# Patient Record
Sex: Female | Born: 1955 | Race: White | Hispanic: No | Smoking: Never smoker
Health system: Southern US, Community
[De-identification: ages and names within clinical notes are randomized; demographics above are authoritative.]

## PROBLEM LIST (undated history)

## (undated) DIAGNOSIS — G4733 Obstructive sleep apnea (adult) (pediatric): Secondary | ICD-10-CM

## (undated) DIAGNOSIS — R739 Hyperglycemia, unspecified: Secondary | ICD-10-CM

## (undated) DIAGNOSIS — M1990 Unspecified osteoarthritis, unspecified site: Secondary | ICD-10-CM

## (undated) DIAGNOSIS — K8020 Calculus of gallbladder without cholecystitis without obstruction: Secondary | ICD-10-CM

## (undated) DIAGNOSIS — E785 Hyperlipidemia, unspecified: Secondary | ICD-10-CM

## (undated) DIAGNOSIS — K760 Fatty (change of) liver, not elsewhere classified: Secondary | ICD-10-CM

## (undated) DIAGNOSIS — F32A Depression, unspecified: Secondary | ICD-10-CM

## (undated) DIAGNOSIS — F329 Major depressive disorder, single episode, unspecified: Secondary | ICD-10-CM

## (undated) DIAGNOSIS — Z79890 Hormone replacement therapy (postmenopausal): Secondary | ICD-10-CM

## (undated) DIAGNOSIS — K7689 Other specified diseases of liver: Secondary | ICD-10-CM

## (undated) DIAGNOSIS — K219 Gastro-esophageal reflux disease without esophagitis: Secondary | ICD-10-CM

## (undated) DIAGNOSIS — K750 Abscess of liver: Secondary | ICD-10-CM

## (undated) HISTORY — DX: Hormone replacement therapy: Z79.890

## (undated) HISTORY — DX: Gastro-esophageal reflux disease without esophagitis: K21.9

## (undated) HISTORY — DX: Obstructive sleep apnea (adult) (pediatric): G47.33

## (undated) HISTORY — PX: VESICOVAGINAL FISTULA CLOSURE W/ TAH: SUR271

## (undated) HISTORY — DX: Abscess of liver: K75.0

## (undated) HISTORY — PX: INGUINAL HERNIA REPAIR: SHX194

## (undated) HISTORY — DX: Unspecified osteoarthritis, unspecified site: M19.90

## (undated) HISTORY — DX: Hyperglycemia, unspecified: R73.9

## (undated) HISTORY — DX: Major depressive disorder, single episode, unspecified: F32.9

## (undated) HISTORY — DX: Hyperlipidemia, unspecified: E78.5

## (undated) HISTORY — DX: Fatty (change of) liver, not elsewhere classified: K76.0

## (undated) HISTORY — PX: APPENDECTOMY: SHX54

## (undated) HISTORY — DX: Other specified diseases of liver: K76.89

## (undated) HISTORY — PX: EAR CYST EXCISION: SHX22

## (undated) HISTORY — DX: Depression, unspecified: F32.A

## (undated) HISTORY — DX: Calculus of gallbladder without cholecystitis without obstruction: K80.20

---

## 1997-11-11 ENCOUNTER — Ambulatory Visit (HOSPITAL_COMMUNITY): Admission: RE | Admit: 1997-11-11 | Discharge: 1997-11-11 | Payer: Self-pay | Admitting: Obstetrics and Gynecology

## 1998-06-30 ENCOUNTER — Inpatient Hospital Stay (HOSPITAL_COMMUNITY): Admission: RE | Admit: 1998-06-30 | Discharge: 1998-07-02 | Payer: Self-pay | Admitting: Obstetrics and Gynecology

## 1999-07-28 ENCOUNTER — Other Ambulatory Visit: Admission: RE | Admit: 1999-07-28 | Discharge: 1999-07-28 | Payer: Self-pay | Admitting: Obstetrics and Gynecology

## 1999-11-26 ENCOUNTER — Ambulatory Visit (HOSPITAL_COMMUNITY): Admission: RE | Admit: 1999-11-26 | Discharge: 1999-11-26 | Payer: Self-pay | Admitting: Internal Medicine

## 1999-12-03 ENCOUNTER — Ambulatory Visit (HOSPITAL_COMMUNITY): Admission: RE | Admit: 1999-12-03 | Discharge: 1999-12-03 | Payer: Self-pay | Admitting: Internal Medicine

## 1999-12-03 ENCOUNTER — Encounter: Payer: Self-pay | Admitting: Internal Medicine

## 2000-08-31 ENCOUNTER — Other Ambulatory Visit: Admission: RE | Admit: 2000-08-31 | Discharge: 2000-08-31 | Payer: Self-pay | Admitting: Obstetrics and Gynecology

## 2001-09-20 ENCOUNTER — Other Ambulatory Visit: Admission: RE | Admit: 2001-09-20 | Discharge: 2001-09-20 | Payer: Self-pay | Admitting: Obstetrics and Gynecology

## 2002-07-23 ENCOUNTER — Encounter: Payer: Self-pay | Admitting: Internal Medicine

## 2002-07-23 ENCOUNTER — Ambulatory Visit (HOSPITAL_BASED_OUTPATIENT_CLINIC_OR_DEPARTMENT_OTHER): Admission: RE | Admit: 2002-07-23 | Discharge: 2002-07-23 | Payer: Self-pay | Admitting: Internal Medicine

## 2002-10-14 ENCOUNTER — Emergency Department (HOSPITAL_COMMUNITY): Admission: EM | Admit: 2002-10-14 | Discharge: 2002-10-14 | Payer: Self-pay

## 2002-11-06 ENCOUNTER — Other Ambulatory Visit: Admission: RE | Admit: 2002-11-06 | Discharge: 2002-11-06 | Payer: Self-pay | Admitting: Obstetrics and Gynecology

## 2003-05-29 ENCOUNTER — Ambulatory Visit (HOSPITAL_COMMUNITY): Admission: RE | Admit: 2003-05-29 | Discharge: 2003-05-29 | Payer: Self-pay | Admitting: General Surgery

## 2003-05-29 ENCOUNTER — Encounter: Payer: Self-pay | Admitting: General Surgery

## 2003-11-14 ENCOUNTER — Other Ambulatory Visit: Admission: RE | Admit: 2003-11-14 | Discharge: 2003-11-14 | Payer: Self-pay | Admitting: Obstetrics and Gynecology

## 2003-12-05 ENCOUNTER — Ambulatory Visit (HOSPITAL_COMMUNITY): Admission: RE | Admit: 2003-12-05 | Discharge: 2003-12-05 | Payer: Self-pay | Admitting: Internal Medicine

## 2004-03-04 ENCOUNTER — Ambulatory Visit (HOSPITAL_COMMUNITY): Admission: RE | Admit: 2004-03-04 | Discharge: 2004-03-04 | Payer: Self-pay | Admitting: Orthopedic Surgery

## 2004-03-11 ENCOUNTER — Encounter: Admission: RE | Admit: 2004-03-11 | Discharge: 2004-06-09 | Payer: Self-pay | Admitting: Orthopedic Surgery

## 2004-04-24 ENCOUNTER — Ambulatory Visit (HOSPITAL_COMMUNITY): Admission: RE | Admit: 2004-04-24 | Discharge: 2004-04-24 | Payer: Self-pay | Admitting: Obstetrics and Gynecology

## 2004-05-11 ENCOUNTER — Encounter: Admission: RE | Admit: 2004-05-11 | Discharge: 2004-08-09 | Payer: Self-pay | Admitting: Endocrinology

## 2004-07-27 ENCOUNTER — Ambulatory Visit (HOSPITAL_COMMUNITY): Admission: RE | Admit: 2004-07-27 | Discharge: 2004-07-27 | Payer: Self-pay | Admitting: General Surgery

## 2004-08-06 ENCOUNTER — Ambulatory Visit: Payer: Self-pay | Admitting: Internal Medicine

## 2004-09-28 ENCOUNTER — Ambulatory Visit: Payer: Self-pay | Admitting: Internal Medicine

## 2004-10-12 ENCOUNTER — Ambulatory Visit: Payer: Self-pay | Admitting: Internal Medicine

## 2004-11-19 ENCOUNTER — Other Ambulatory Visit: Admission: RE | Admit: 2004-11-19 | Discharge: 2004-11-19 | Payer: Self-pay | Admitting: Obstetrics and Gynecology

## 2005-02-05 ENCOUNTER — Ambulatory Visit (HOSPITAL_COMMUNITY): Admission: RE | Admit: 2005-02-05 | Discharge: 2005-02-05 | Payer: Self-pay | Admitting: Obstetrics and Gynecology

## 2005-02-24 ENCOUNTER — Ambulatory Visit (HOSPITAL_COMMUNITY): Admission: RE | Admit: 2005-02-24 | Discharge: 2005-02-24 | Payer: Self-pay | Admitting: Surgery

## 2005-02-25 ENCOUNTER — Encounter: Admission: RE | Admit: 2005-02-25 | Discharge: 2005-05-26 | Payer: Self-pay | Admitting: Surgery

## 2005-03-17 ENCOUNTER — Ambulatory Visit (HOSPITAL_COMMUNITY): Admission: RE | Admit: 2005-03-17 | Discharge: 2005-03-17 | Payer: Self-pay | Admitting: Surgery

## 2005-04-20 ENCOUNTER — Encounter: Payer: Self-pay | Admitting: Internal Medicine

## 2005-04-26 ENCOUNTER — Ambulatory Visit (HOSPITAL_COMMUNITY): Admission: RE | Admit: 2005-04-26 | Discharge: 2005-04-27 | Payer: Self-pay | Admitting: Otolaryngology

## 2005-05-04 ENCOUNTER — Ambulatory Visit (HOSPITAL_COMMUNITY): Admission: RE | Admit: 2005-05-04 | Discharge: 2005-05-04 | Payer: Self-pay | Admitting: Obstetrics and Gynecology

## 2005-07-16 ENCOUNTER — Encounter: Admission: RE | Admit: 2005-07-16 | Discharge: 2005-10-14 | Payer: Self-pay | Admitting: Surgery

## 2005-08-16 HISTORY — PX: LAPAROSCOPIC GASTRIC BANDING: SHX1100

## 2005-11-22 ENCOUNTER — Other Ambulatory Visit: Admission: RE | Admit: 2005-11-22 | Discharge: 2005-11-22 | Payer: Self-pay | Admitting: Obstetrics and Gynecology

## 2005-11-24 ENCOUNTER — Ambulatory Visit (HOSPITAL_COMMUNITY): Admission: RE | Admit: 2005-11-24 | Discharge: 2005-11-24 | Payer: Self-pay | Admitting: Gastroenterology

## 2005-11-24 ENCOUNTER — Encounter (INDEPENDENT_AMBULATORY_CARE_PROVIDER_SITE_OTHER): Payer: Self-pay | Admitting: *Deleted

## 2005-12-13 ENCOUNTER — Ambulatory Visit: Payer: Self-pay | Admitting: Internal Medicine

## 2006-01-03 ENCOUNTER — Ambulatory Visit (HOSPITAL_COMMUNITY): Admission: RE | Admit: 2006-01-03 | Discharge: 2006-01-03 | Payer: Self-pay | Admitting: Physician Assistant

## 2006-03-03 ENCOUNTER — Encounter: Admission: RE | Admit: 2006-03-03 | Discharge: 2006-03-03 | Payer: Self-pay | Admitting: Surgery

## 2006-05-11 ENCOUNTER — Ambulatory Visit (HOSPITAL_COMMUNITY): Admission: RE | Admit: 2006-05-11 | Discharge: 2006-05-11 | Payer: Self-pay | Admitting: Internal Medicine

## 2006-05-20 ENCOUNTER — Encounter: Admission: RE | Admit: 2006-05-20 | Discharge: 2006-05-20 | Payer: Self-pay | Admitting: Internal Medicine

## 2006-07-10 IMAGING — CR DG FOOT COMPLETE 3+V*L*
3 series · 3 of 3 positions shown · non-contrast
Comparison: none

CLINICAL DATA: Pain and bruising after dropping mirror on toes.
LEFT FOOT COMPLETE:
Three views of the left foot reveals no evidence of fracture, dislocation or other acute bony abnormality.    inferior calcaneal spur is noted.

[view not recorded (1 of 3)]
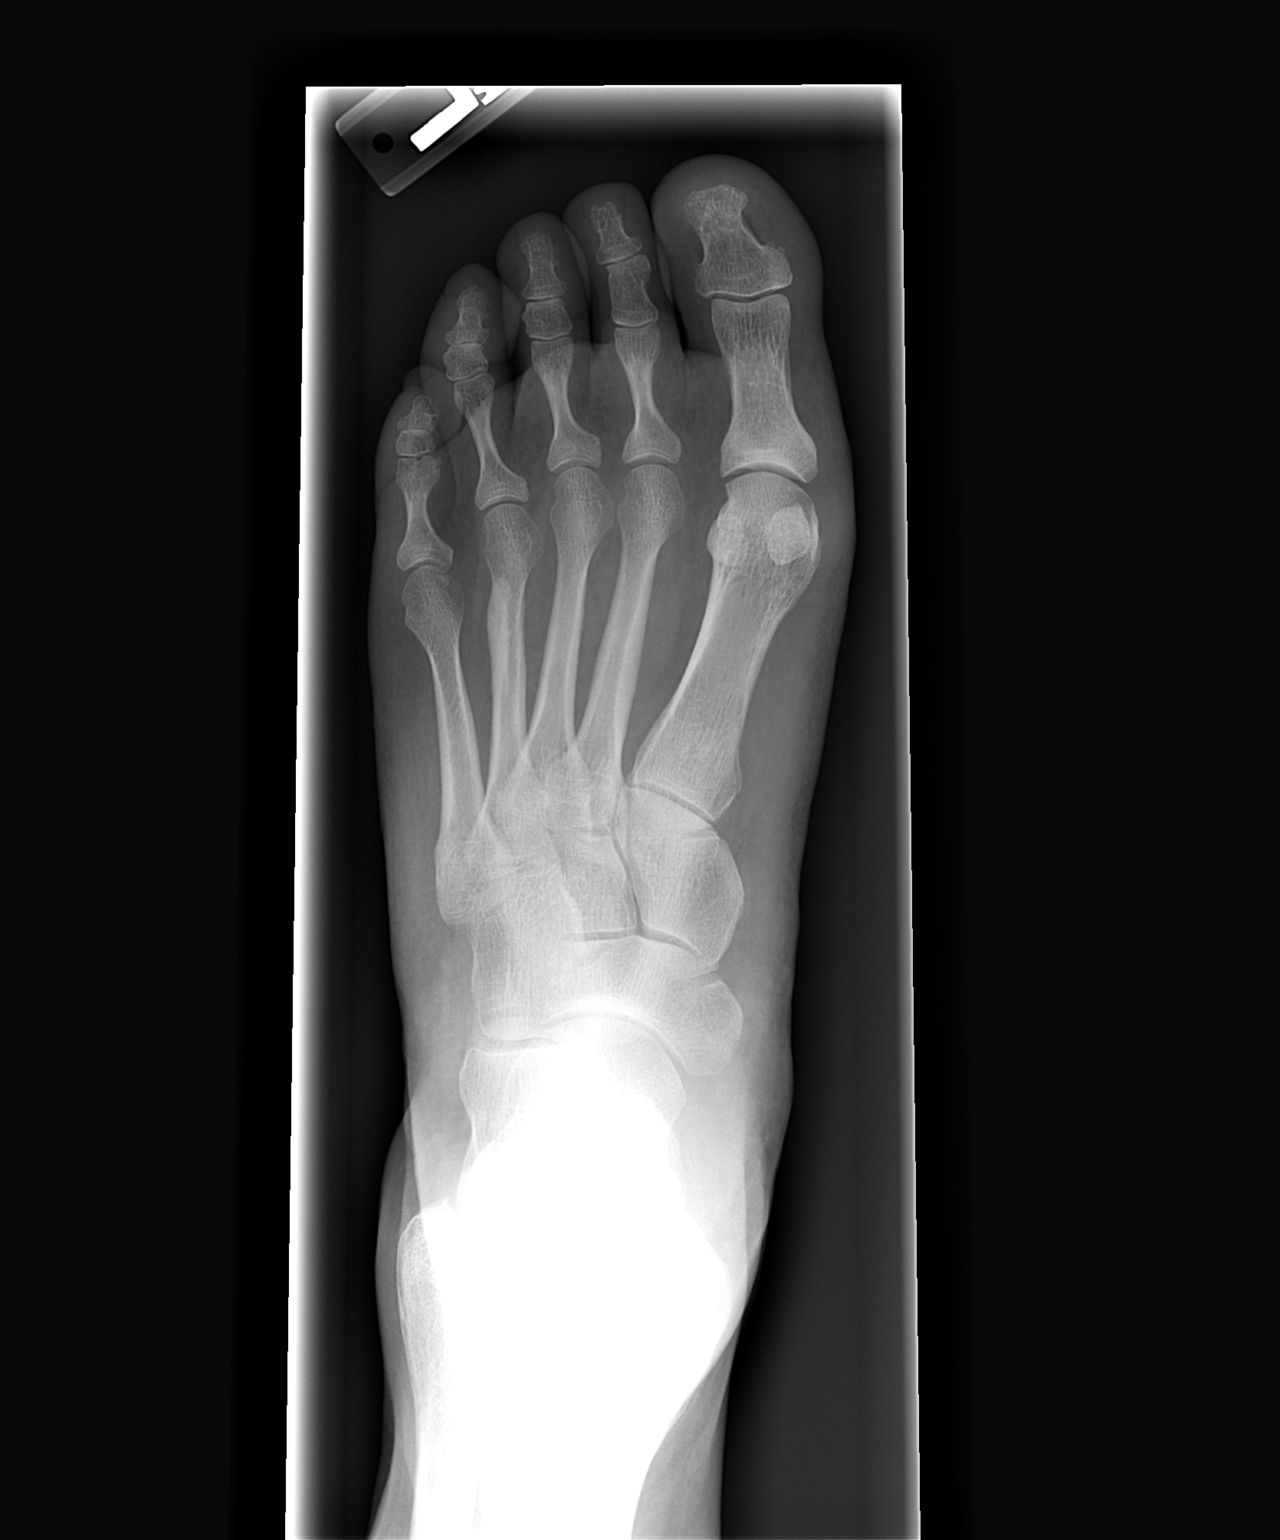

[view not recorded (2 of 3)]
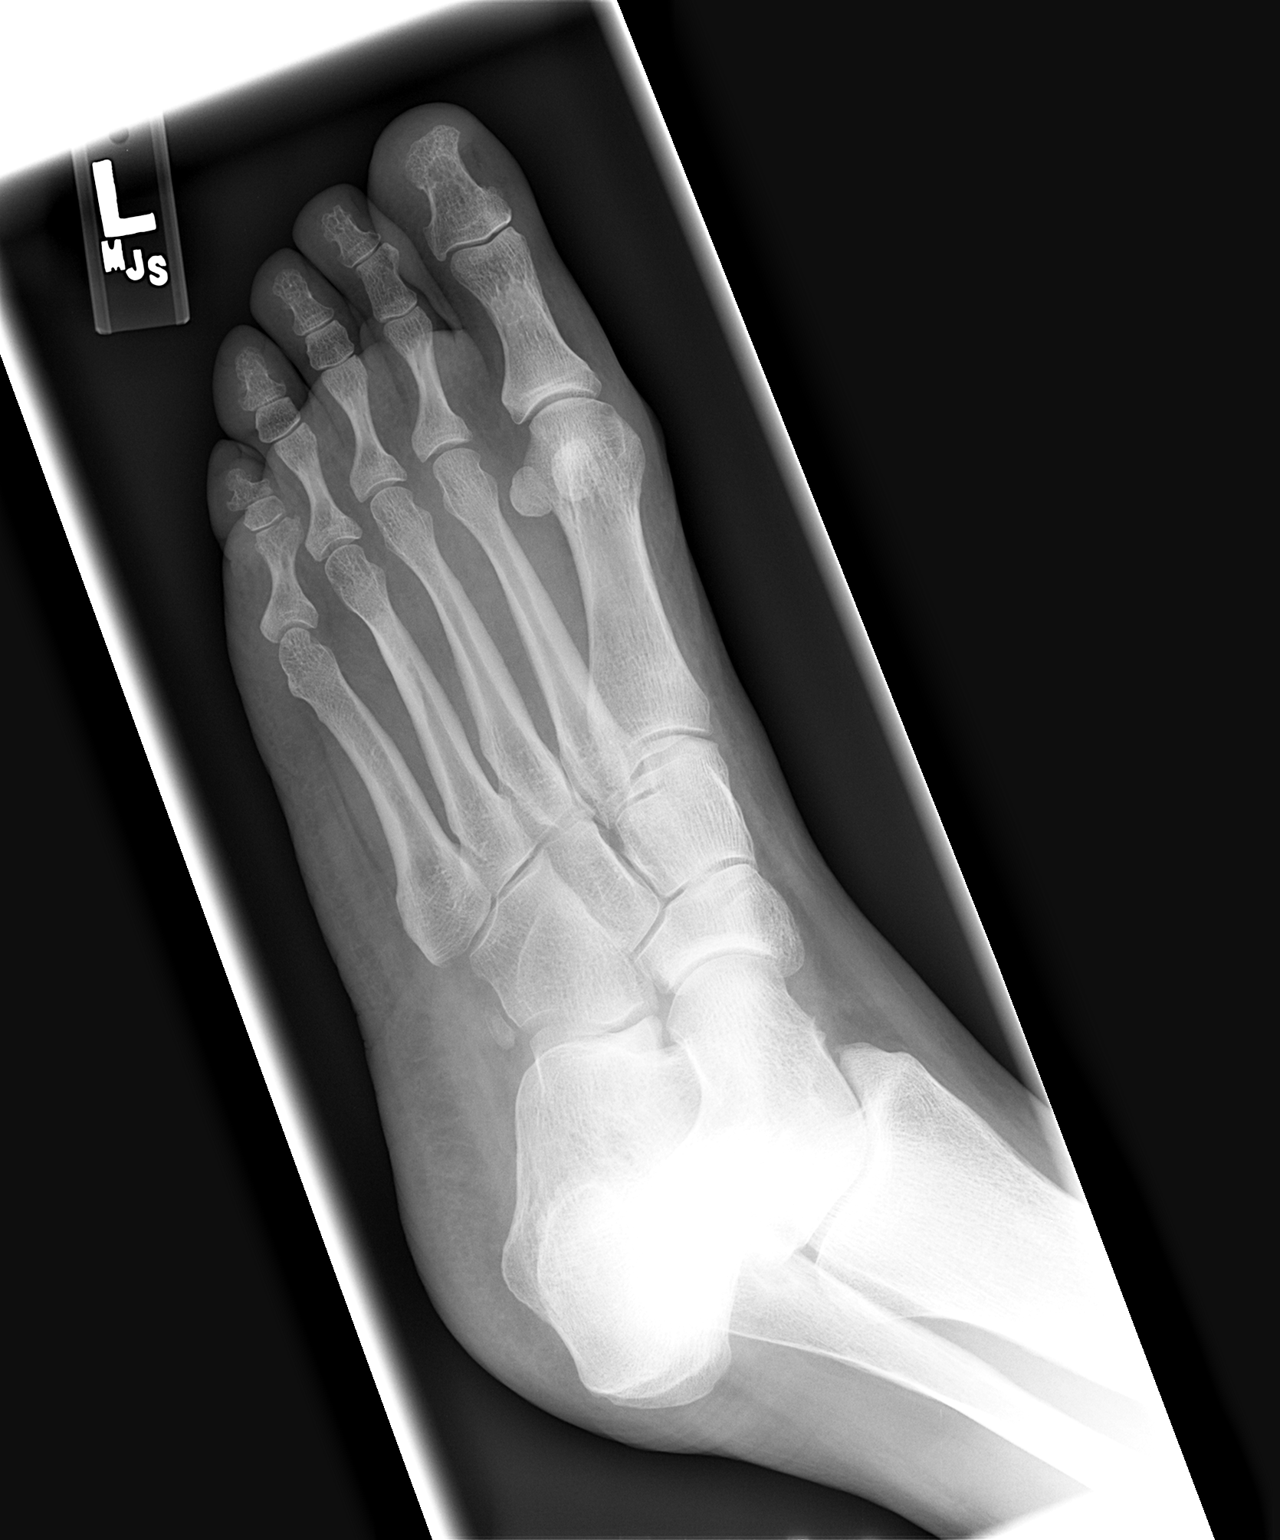

[view not recorded (3 of 3)]
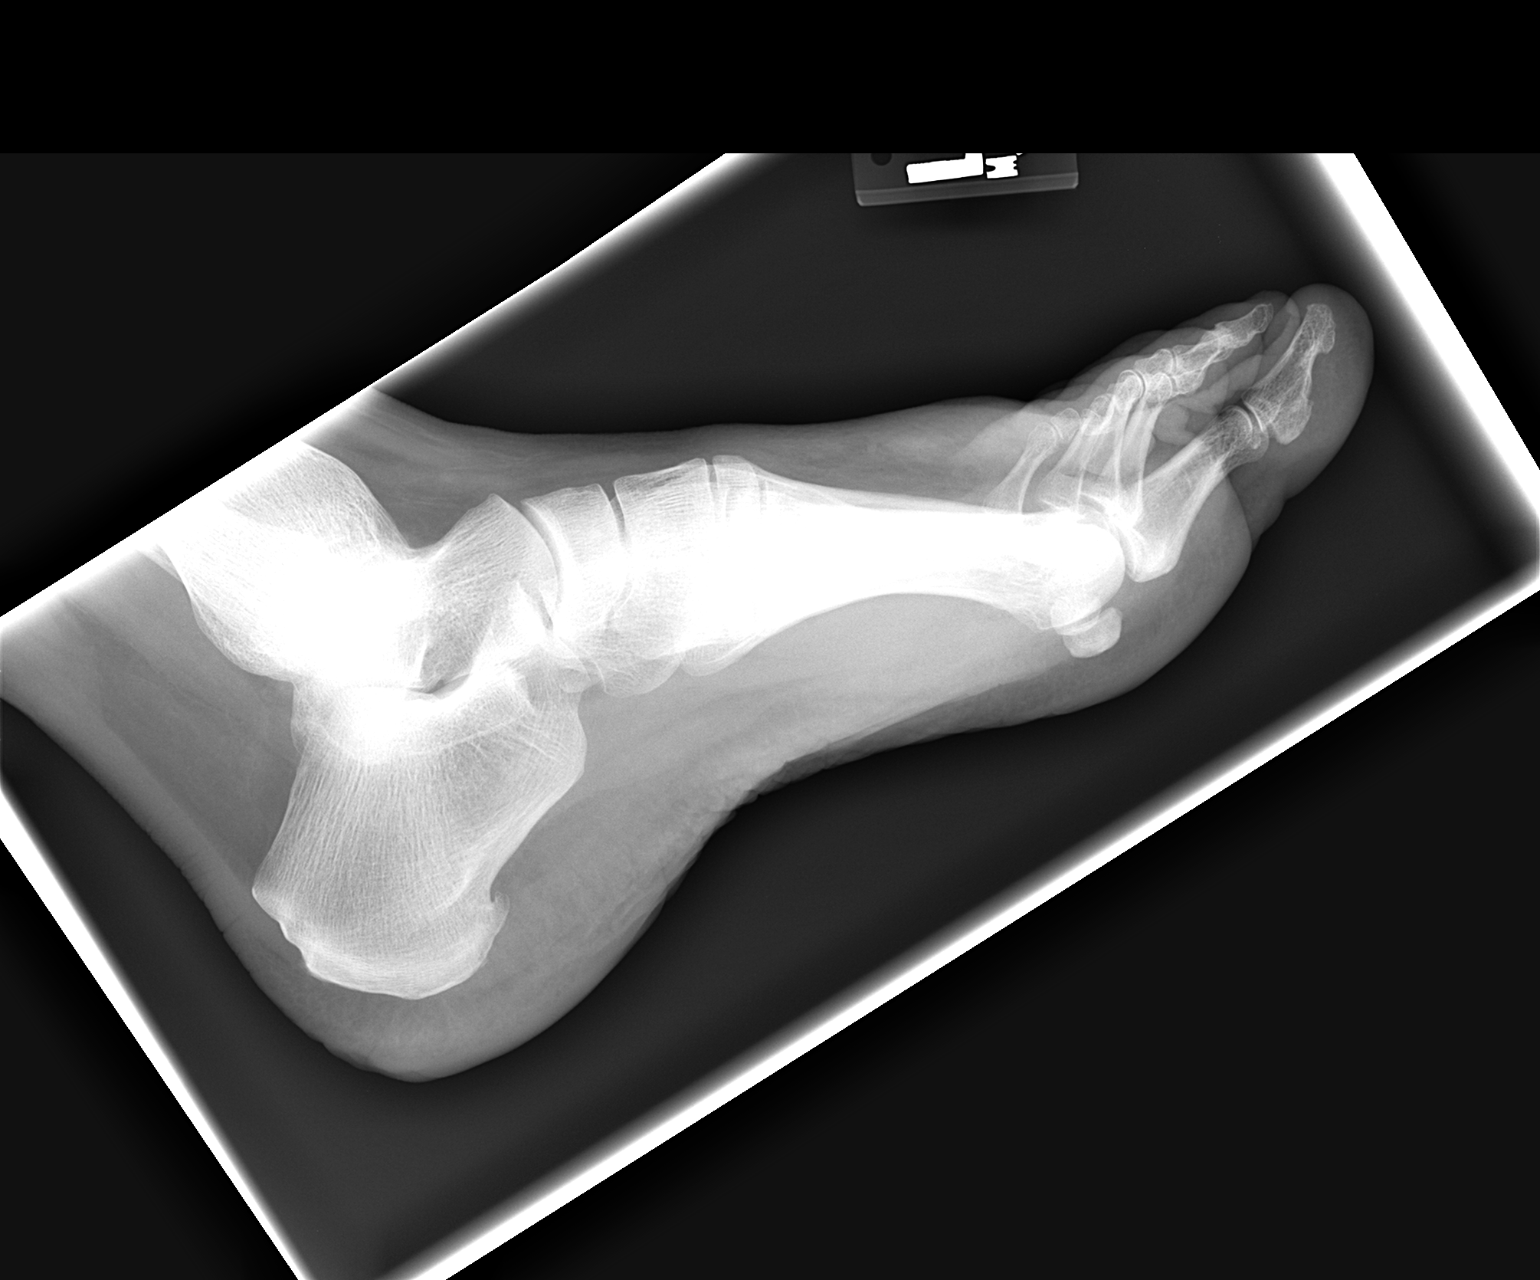

[3 of 3 positions shown; findings below may reference images not displayed]

IMPRESSION: No acute bony abnormalities noted of the left foot.

## 2006-09-08 ENCOUNTER — Ambulatory Visit (HOSPITAL_COMMUNITY): Admission: RE | Admit: 2006-09-08 | Discharge: 2006-09-08 | Payer: Self-pay | Admitting: Orthopedic Surgery

## 2006-09-16 ENCOUNTER — Ambulatory Visit (HOSPITAL_COMMUNITY): Admission: RE | Admit: 2006-09-16 | Discharge: 2006-09-16 | Payer: Self-pay | Admitting: Orthopedic Surgery

## 2006-12-19 ENCOUNTER — Ambulatory Visit: Payer: Self-pay | Admitting: Internal Medicine

## 2006-12-19 LAB — CONVERTED CEMR LAB
ALT: 23 units/L (ref 0–40)
AST: 29 units/L (ref 0–37)
Albumin: 4.2 g/dL (ref 3.5–5.2)
Basophils Absolute: 0 10*3/uL (ref 0.0–0.1)
Basophils Relative: 0.4 % (ref 0.0–1.0)
Bilirubin, Direct: 0.1 mg/dL (ref 0.0–0.3)
Cholesterol: 174 mg/dL (ref 0–200)
Creatinine, Ser: 0.8 mg/dL (ref 0.4–1.2)
Eosinophils Absolute: 0.1 10*3/uL (ref 0.0–0.6)
Eosinophils Relative: 2.5 % (ref 0.0–5.0)
GFR calc Af Amer: 97 mL/min
GFR calc non Af Amer: 80 mL/min
Glucose, Bld: 89 mg/dL (ref 70–99)
HDL: 59 mg/dL (ref >39.0)
LDL Cholesterol: 86 mg/dL (ref 0–99)
MCHC: 35.3 g/dL (ref 30.0–36.0)
Neutrophils Relative %: 44.4 % (ref 43.0–77.0)
Platelets: 261 10*3/uL (ref 150–400)
RBC: 4.23 M/uL (ref 3.87–5.11)
RDW: 12.3 % (ref 11.5–14.6)
Total Bilirubin: 0.6 mg/dL (ref 0.3–1.2)
Triglycerides: 143 mg/dL (ref 0–149)

## 2006-12-26 ENCOUNTER — Ambulatory Visit: Payer: Self-pay | Admitting: Internal Medicine

## 2007-03-23 ENCOUNTER — Ambulatory Visit (HOSPITAL_COMMUNITY): Admission: RE | Admit: 2007-03-23 | Discharge: 2007-03-23 | Payer: Self-pay | Admitting: General Surgery

## 2007-03-23 ENCOUNTER — Telehealth: Payer: Self-pay | Admitting: Internal Medicine

## 2007-04-12 ENCOUNTER — Ambulatory Visit: Payer: Self-pay | Admitting: Internal Medicine

## 2007-04-12 DIAGNOSIS — K219 Gastro-esophageal reflux disease without esophagitis: Secondary | ICD-10-CM | POA: Insufficient documentation

## 2007-04-12 DIAGNOSIS — E785 Hyperlipidemia, unspecified: Secondary | ICD-10-CM | POA: Insufficient documentation

## 2007-04-12 DIAGNOSIS — M1990 Unspecified osteoarthritis, unspecified site: Secondary | ICD-10-CM | POA: Insufficient documentation

## 2007-04-12 DIAGNOSIS — F329 Major depressive disorder, single episode, unspecified: Secondary | ICD-10-CM

## 2007-04-12 DIAGNOSIS — F3289 Other specified depressive episodes: Secondary | ICD-10-CM | POA: Insufficient documentation

## 2007-04-12 DIAGNOSIS — R5381 Other malaise: Secondary | ICD-10-CM | POA: Insufficient documentation

## 2007-04-12 DIAGNOSIS — R5383 Other fatigue: Secondary | ICD-10-CM

## 2007-04-14 ENCOUNTER — Telehealth: Payer: Self-pay | Admitting: Internal Medicine

## 2007-04-25 ENCOUNTER — Encounter: Payer: Self-pay | Admitting: Internal Medicine

## 2007-05-25 ENCOUNTER — Ambulatory Visit (HOSPITAL_COMMUNITY): Admission: RE | Admit: 2007-05-25 | Discharge: 2007-05-25 | Payer: Self-pay | Admitting: Obstetrics and Gynecology

## 2007-10-24 ENCOUNTER — Telehealth: Payer: Self-pay | Admitting: Internal Medicine

## 2008-01-23 ENCOUNTER — Emergency Department (HOSPITAL_COMMUNITY): Admission: EM | Admit: 2008-01-23 | Discharge: 2008-01-23 | Payer: Self-pay | Admitting: Emergency Medicine

## 2008-01-23 ENCOUNTER — Ambulatory Visit: Payer: Self-pay | Admitting: Infectious Diseases

## 2008-01-23 DIAGNOSIS — R509 Fever, unspecified: Secondary | ICD-10-CM | POA: Insufficient documentation

## 2008-01-23 LAB — CONVERTED CEMR LAB
ALT: 20 units/L (ref 0–35)
Albumin: 4.6 g/dL (ref 3.5–5.2)
Bilirubin, Direct: 0.1 mg/dL (ref 0.0–0.3)
Total Bilirubin: 0.4 mg/dL (ref 0.3–1.2)

## 2008-06-20 ENCOUNTER — Ambulatory Visit (HOSPITAL_COMMUNITY): Admission: RE | Admit: 2008-06-20 | Discharge: 2008-06-20 | Payer: Self-pay | Admitting: Obstetrics and Gynecology

## 2008-10-14 LAB — CONVERTED CEMR LAB: Pap Smear: NORMAL

## 2008-10-16 ENCOUNTER — Encounter: Payer: Self-pay | Admitting: Internal Medicine

## 2008-11-04 ENCOUNTER — Encounter: Payer: Self-pay | Admitting: Internal Medicine

## 2008-12-09 ENCOUNTER — Telehealth: Payer: Self-pay | Admitting: *Deleted

## 2009-02-21 ENCOUNTER — Telehealth: Payer: Self-pay | Admitting: *Deleted

## 2009-05-03 ENCOUNTER — Ambulatory Visit (HOSPITAL_COMMUNITY): Admission: RE | Admit: 2009-05-03 | Discharge: 2009-05-03 | Payer: Self-pay | Admitting: Orthopedic Surgery

## 2009-06-10 ENCOUNTER — Ambulatory Visit: Payer: Self-pay | Admitting: Internal Medicine

## 2009-06-10 LAB — CONVERTED CEMR LAB
AST: 31 units/L (ref 0–37)
Alkaline Phosphatase: 40 units/L (ref 39–117)
BUN: 10 mg/dL (ref 6–23)
Basophils Absolute: 0.1 10*3/uL (ref 0.0–0.1)
Bilirubin Urine: NEGATIVE
Bilirubin, Direct: 0 mg/dL (ref 0.0–0.3)
CO2: 28 meq/L (ref 19–32)
Chloride: 107 meq/L (ref 96–112)
Eosinophils Absolute: 0.2 10*3/uL (ref 0.0–0.7)
Eosinophils Relative: 4.3 % (ref 0.0–5.0)
Glucose, Bld: 95 mg/dL (ref 70–99)
Glucose, Urine, Semiquant: NEGATIVE
Hemoglobin: 12.5 g/dL (ref 12.0–15.0)
Ketones, urine, test strip: NEGATIVE
Lymphocytes Relative: 37.2 % (ref 12.0–46.0)
Monocytes Absolute: 0.4 10*3/uL (ref 0.1–1.0)
Monocytes Relative: 6.7 % (ref 3.0–12.0)
Potassium: 4.2 meq/L (ref 3.5–5.1)
RDW: 13.4 % (ref 11.5–14.6)
Sodium: 144 meq/L (ref 135–145)
Total Protein: 6.6 g/dL (ref 6.0–8.3)
Triglycerides: 91 mg/dL (ref 0.0–149.0)
Urobilinogen, UA: 1
VLDL: 18.2 mg/dL (ref 0.0–40.0)
WBC Urine, dipstick: NEGATIVE
pH: 8.5

## 2009-06-17 ENCOUNTER — Ambulatory Visit: Payer: Self-pay | Admitting: Internal Medicine

## 2009-06-17 DIAGNOSIS — IMO0002 Reserved for concepts with insufficient information to code with codable children: Secondary | ICD-10-CM | POA: Insufficient documentation

## 2009-06-17 DIAGNOSIS — E669 Obesity, unspecified: Secondary | ICD-10-CM | POA: Insufficient documentation

## 2009-06-17 DIAGNOSIS — R0989 Other specified symptoms and signs involving the circulatory and respiratory systems: Secondary | ICD-10-CM | POA: Insufficient documentation

## 2009-06-17 DIAGNOSIS — R0609 Other forms of dyspnea: Secondary | ICD-10-CM | POA: Insufficient documentation

## 2009-06-17 DIAGNOSIS — M1710 Unilateral primary osteoarthritis, unspecified knee: Secondary | ICD-10-CM

## 2009-06-19 DIAGNOSIS — R32 Unspecified urinary incontinence: Secondary | ICD-10-CM | POA: Insufficient documentation

## 2009-07-17 ENCOUNTER — Ambulatory Visit: Payer: Self-pay | Admitting: Internal Medicine

## 2009-07-21 ENCOUNTER — Encounter (INDEPENDENT_AMBULATORY_CARE_PROVIDER_SITE_OTHER): Payer: Self-pay | Admitting: *Deleted

## 2009-07-30 ENCOUNTER — Ambulatory Visit (HOSPITAL_BASED_OUTPATIENT_CLINIC_OR_DEPARTMENT_OTHER): Admission: RE | Admit: 2009-07-30 | Discharge: 2009-07-30 | Payer: Self-pay | Admitting: Internal Medicine

## 2009-07-30 ENCOUNTER — Encounter: Payer: Self-pay | Admitting: Internal Medicine

## 2009-08-02 ENCOUNTER — Ambulatory Visit: Payer: Self-pay | Admitting: Internal Medicine

## 2009-08-18 ENCOUNTER — Ambulatory Visit: Payer: Self-pay | Admitting: Internal Medicine

## 2009-09-08 ENCOUNTER — Telehealth: Payer: Self-pay | Admitting: Internal Medicine

## 2009-09-18 ENCOUNTER — Ambulatory Visit: Payer: Self-pay | Admitting: Internal Medicine

## 2009-09-23 ENCOUNTER — Ambulatory Visit: Payer: Self-pay | Admitting: Internal Medicine

## 2009-10-14 LAB — CONVERTED CEMR LAB: Pap Smear: NORMAL

## 2009-11-15 ENCOUNTER — Encounter: Payer: Self-pay | Admitting: Internal Medicine

## 2009-11-17 ENCOUNTER — Emergency Department (HOSPITAL_COMMUNITY): Admission: EM | Admit: 2009-11-17 | Discharge: 2009-11-17 | Payer: Self-pay | Admitting: Emergency Medicine

## 2009-11-24 ENCOUNTER — Encounter: Payer: Self-pay | Admitting: Internal Medicine

## 2009-11-28 ENCOUNTER — Ambulatory Visit (HOSPITAL_COMMUNITY): Admission: RE | Admit: 2009-11-28 | Discharge: 2009-11-28 | Payer: Self-pay | Admitting: Obstetrics and Gynecology

## 2009-12-30 ENCOUNTER — Encounter: Payer: Self-pay | Admitting: Internal Medicine

## 2009-12-30 ENCOUNTER — Ambulatory Visit (HOSPITAL_COMMUNITY): Admission: RE | Admit: 2009-12-30 | Discharge: 2009-12-30 | Payer: Self-pay | Admitting: Surgery

## 2009-12-31 ENCOUNTER — Encounter: Payer: Self-pay | Admitting: Internal Medicine

## 2010-01-06 ENCOUNTER — Encounter: Payer: Self-pay | Admitting: Internal Medicine

## 2010-01-06 ENCOUNTER — Ambulatory Visit (HOSPITAL_COMMUNITY): Admission: RE | Admit: 2010-01-06 | Discharge: 2010-01-06 | Payer: Self-pay | Admitting: Surgery

## 2010-01-14 ENCOUNTER — Encounter: Payer: Self-pay | Admitting: Internal Medicine

## 2010-01-14 HISTORY — PX: HIATAL HERNIA REPAIR: SHX195

## 2010-01-19 ENCOUNTER — Observation Stay (HOSPITAL_COMMUNITY): Admission: RE | Admit: 2010-01-19 | Discharge: 2010-01-20 | Payer: Self-pay | Admitting: Surgery

## 2010-01-20 ENCOUNTER — Encounter: Payer: Self-pay | Admitting: Internal Medicine

## 2010-02-04 ENCOUNTER — Encounter: Admission: RE | Admit: 2010-02-04 | Discharge: 2010-05-05 | Payer: Self-pay | Admitting: Surgery

## 2010-03-18 ENCOUNTER — Telehealth: Payer: Self-pay | Admitting: Internal Medicine

## 2010-04-08 ENCOUNTER — Encounter: Payer: Self-pay | Admitting: Internal Medicine

## 2010-04-14 ENCOUNTER — Ambulatory Visit (HOSPITAL_COMMUNITY): Admission: RE | Admit: 2010-04-14 | Discharge: 2010-04-14 | Payer: Self-pay | Admitting: Surgery

## 2010-04-14 ENCOUNTER — Encounter: Payer: Self-pay | Admitting: Internal Medicine

## 2010-04-17 ENCOUNTER — Ambulatory Visit: Payer: Self-pay | Admitting: Internal Medicine

## 2010-04-17 DIAGNOSIS — A064 Amebic liver abscess: Secondary | ICD-10-CM | POA: Insufficient documentation

## 2010-04-17 DIAGNOSIS — Z8613 Personal history of malaria: Secondary | ICD-10-CM | POA: Insufficient documentation

## 2010-05-21 ENCOUNTER — Encounter: Payer: Self-pay | Admitting: Internal Medicine

## 2010-06-11 ENCOUNTER — Encounter: Payer: Self-pay | Admitting: Internal Medicine

## 2010-06-15 ENCOUNTER — Encounter: Payer: Self-pay | Admitting: Internal Medicine

## 2010-07-03 ENCOUNTER — Telehealth: Payer: Self-pay | Admitting: *Deleted

## 2010-07-08 ENCOUNTER — Telehealth: Payer: Self-pay | Admitting: Internal Medicine

## 2010-07-28 ENCOUNTER — Ambulatory Visit: Payer: Self-pay | Admitting: Internal Medicine

## 2010-07-28 DIAGNOSIS — H9190 Unspecified hearing loss, unspecified ear: Secondary | ICD-10-CM | POA: Insufficient documentation

## 2010-07-28 DIAGNOSIS — E039 Hypothyroidism, unspecified: Secondary | ICD-10-CM | POA: Insufficient documentation

## 2010-07-28 DIAGNOSIS — R7309 Other abnormal glucose: Secondary | ICD-10-CM | POA: Insufficient documentation

## 2010-08-20 ENCOUNTER — Encounter: Payer: Self-pay | Admitting: Internal Medicine

## 2010-09-05 ENCOUNTER — Encounter: Payer: Self-pay | Admitting: Internal Medicine

## 2010-09-06 ENCOUNTER — Encounter: Payer: Self-pay | Admitting: Surgery

## 2010-09-06 ENCOUNTER — Encounter: Payer: Self-pay | Admitting: Obstetrics and Gynecology

## 2010-09-17 NOTE — Progress Notes (Signed)
Summary: REQUEST FOR CPX (Can be worked into schedule - Dec. 2011)  Phone Note Other Incoming   Caller: Carollee Herter, CMA at [facility] [facility] Summary of Call: Carollee Herter, CMA adv that pt can be worked into schedule at some time during the month of December for cpx.... Pt had labwork done prior and will bring results.  LMOM at  #  [phone]  req pt to c/b so appt can be scheduled.  Initial call taken by: Debbra Riding,  July 08, 2010 11:34 AM

## 2010-09-17 NOTE — Letter (Signed)
Summary: [facility] at [facility] at [facility]   Imported By: Maryln Gottron 11/26/2009 14:00:58  _____________________________________________________________________  External Attachment:    Type:   Image     Comment:   External Document

## 2010-09-17 NOTE — Letter (Signed)
Summary: [facility]   Imported By: Sherian Rein 04/22/2010 11:42:26  _____________________________________________________________________  External Attachment:    Type:   Image     Comment:   External Document

## 2010-09-17 NOTE — Letter (Signed)
Summary: [facility]   Imported By: Maryln Gottron 06/08/2010 12:35:41  _____________________________________________________________________  External Attachment:    Type:   Image     Comment:   External Document

## 2010-09-17 NOTE — Letter (Signed)
Summary: [facility]   Imported By: Maryln Gottron 01/19/2010 10:08:25  _____________________________________________________________________  External Attachment:    Type:   Image     Comment:   External Document

## 2010-09-17 NOTE — Assessment & Plan Note (Signed)
Summary: rov 1 month///kp   Primary Provider/Referring Provider:  Panosh  CC:  Follow up visit-Nuvigil working.Gloria [name]  History of Present Illness: July 17, 2009-  55yoF seen at kind request of Dr Fabian Sharp because of sleep problems. She had a sleep study 07/23/02 with very loud snoring but normal range RI of 2/hour.She was given a trial of Vivactil and referred for discusion of oral appliance, then lost to folow up. In recent years she has noted waking repeatedly, nonrestorative sleep, falling asleep at her desk. [place] she had taken Ambien to help sleep on the plane. On arrival, her roomate told her she had sleep apnea.  Bedtime 9-930AM, short sleep latency, waking 2-3 times briefly before up at 530AM. She reports losing 70-80 lbs in past 2 years. Takes occasional trazodone for sleep. Some coffee and tea in the morning. Legs may ache at night but no kicking or jerking.  August 18, 2009 OSA Returns for f/u after sleep study.  Very hard cough paroxyxms in sleep since back from [place]. Not relieved by nexium/ pepcid. Discussed HOB elevation. This cough does fragment sleep some, but not clear that it is the sufficient reason for her compalint of tiredness. She doesn't describe cataplexy or sleep paralysis.  She was noted to have some apneas in [place] after Ambien. NPSG- AHI 4.8, RDI 15.4. I told her this would not ordinarily be enough to make [place] try CPAP initially. Maybe an oral appliance.  September 18, 2009- OSA/ hypersomnia She had mild OSA based on RDI 15.4. Had tried Nuvigil- liked it, although first dose at 1/2 x 150 mg kept her awake. She discussed this with Dr Haywood Lasso. We had suggested elevating head of bed to see if that would help cough. She finds this has made a real difference in her cough. She has also had her lap band adjusted, aiming to lose a bit more weight.   Current Medications (verified): 1)  Citalopram Hydrobromide 40 Mg Tabs (Citalopram Hydrobromide)  .Gloria [name].. 1 By Mouth Once Daily 2)  Estrace 0.5 Mg Tabs (Estradiol) .Gloria [name].. 1 By Mouth Once Daily 3)  Fenofibrate 160 Mg Tabs (Fenofibrate) .Gloria [name].. 1 By Mouth Once Daily 4)  Nexium 40 Mg Cpdr (Esomeprazole Magnesium) .... Take 1 Capsule By Mouth Once A Day 5)  Synthroid 75 Mcg Tabs (Levothyroxine Sodium) .Gloria [name].. 1 By Mouth Once Daily 6)  Multiple Vitamins   Tabs (Multiple Vitamin) .... Once Daily 7)  Simvastatin 10 Mg  Tabs (Simvastatin) .Gloria [name].. 1 Once Daily 8)  Metformin Hcl 500 Mg Tabs (Metformin Hcl) .Gloria [name].. 1 By Mouth Once Daily 9)  Benzonatate 100 Mg Caps (Benzonatate) .Gloria [name].. 1 By Mouth Three Times A Day As Needed Cough 10)  Nuvigil 150 Mg Tabs (Armodafinil) .Gloria [name].. 1 Daily As Needed  Allergies (verified): 1)  Sulfamethoxazole (Sulfamethoxazole)  Past History:  Past Surgical History: Last updated: 04/12/2007 Appendectomy Caesarean section Hysterectomy Pilonidal Cyst  Family History: Last updated: 07/17/2009 Family History of CAD Female 1st degree relative <60 Fam hx CHF Family History Diabetes 1st degree relative Family History Hypertension Family History Lung cancer Family History Other cancer-colon Family History of Arthritis Family History Depression Mother, DM, OSA, died of CHF Father- died lung ca.  Duaghter in rehab.   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  The patient denies anorexia, fever, weight gain, vision loss, decreased hearing,  hoarseness, chest pain, syncope, dyspnea on exertion, peripheral edema, prolonged cough, headaches, hemoptysis, and severe indigestion/heartburn.    Vital Signs:  Patient profile:   55 year old female Menstrual status:  hysterectomy Height:      68 inches Weight:      214.38 pounds BMI:     32.71 O2 Sat:      97 % on Room air Pulse rate:   76 / minute BP sitting:   104 / 70  (left arm) Cuff size:   regular  Vitals Entered By: Reynaldo Minium CMA (September 18, 2009 4:14 PM)  O2 Flow:  Room air  Physical Exam  Additional Exam:  General: A/Ox3; pleasant and cooperative, NAD, calm, SKIN: no rash, lesions NODES: no lymphadenopathy HEENT: [place]/AT, EOM- WNL, Conjuctivae- clear, PERRLA, TM-WNL, Nose- turbinate edema, Throat- clear and wnl, [place]  III, small tonsils NECK: Supple w/ fair ROM, JVD- none, normal carotid impulses w/o bruits Thyroid- CHEST: Clear to P&A HEART: RRR, no m/g/r heard ABDOMEN: Soft and nl; She has Portacath right abdominal wall for fluid volume adjustment of her lap band. ZOX:WRUE, nl pulses, no edema  NEURO: Grossly intact to observation      Impression & Recommendations:  Problem # 1:  ? of OBSTRUCTIVE SLEEP APNEA (ICD-327.23) She has trazodone from Dr Haywood Lasso used as a sleep aid if needed, especially discussed as a necessary sleep aid on travels to [place]. She has hypersomnia with mild OSA. Low dose Nuvigil is the appropriate measure, coordinated with her management by her psychiatrist. Weight loss will also be constructive over time.  Problem # 2:  GERD (ICD-530.81)  Anti reflux measures have significantly improved her cough. She will leave head of bed elevated.  Medications Added to Medication List This Visit: 1)  Trazodone Hcl 50 Mg Tabs (Trazodone hcl) .Gloria [name].. 1 for sleep if needed  Other Orders: Est. Patient Level III (45409)  Patient Instructions: 1)  Please schedule a follow-up appointment in 6 months. 2)  Ok to use the [place] on an  occasional basis. 3)  OK to try using trazodone up to 1.5 tabs for sleep occasionally. Prescriptions: TRAZODONE HCL 50 MG TABS (TRAZODONE HCL) 1 for sleep if needed  #30 x 5   Entered and Authorized by:   Waymon Budge MD   Signed by:   Waymon Budge MD on 09/18/2009   Method used:   Historical   RxID:   [id number] NUVIGIL 150 MG TABS (ARMODAFINIL) 1 daily as needed  #30 x 5   Entered and Authorized by:   Waymon Budge MD   Signed by:   Waymon Budge MD on 09/18/2009   Method used:   Historical   RxID:   [id number]

## 2010-09-17 NOTE — Assessment & Plan Note (Signed)
Summary: rov//mbw   Primary Provider/Referring Provider:  Panosh   History of Present Illness: August 18, 2009 OSA Returns for f/u after sleep study.  Very hard cough paroxyxms in sleep since back from [place]. Not relieved by nexium/ pepcid. Discussed HOB elevation. This cough does fragment sleep some, but not clear that it is the sufficient reason for her compalint of tiredness. She doesn't describe cataplexy or sleep paralysis.  She was noted to have some apneas in [place] after Ambien. NPSG- AHI 4.8, RDI 15.4. I told her this would not ordinarily be enough to make [place] try CPAP initially. Maybe an oral appliance.  September 18, 2009- OSA/ hypersomnia She had mild OSA based on RDI 15.4. Had tried Nuvigil- liked it, although first dose at 1/2 x 150 mg kept her awake. She discussed this with Dr Haywood Lasso. We had suggested elevating head of bed to see if that would help cough. She finds this has made a real difference in her cough. She has also had her lap band adjusted, aiming to lose a bit more weight.  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 During that time also found large hiatal hernia- repaired by Ovidio Kin. Night cough is now resolved. 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 Sleep-Sleeping much better at night. Stil needs and likes Nuvigil , taking 1/2-1 daily. Never needed CPAP.    Preventive Screening-Counseling & Management  Alcohol-Tobacco     Alcohol drinks/day: 0     Smoking Status: never  Current Medications (verified): 1)  Citalopram Hydrobromide 40 Mg Tabs (Citalopram Hydrobromide) .Marland Kitchen.. 1 By Mouth Once Daily 2)  Fenofibrate 160 Mg Tabs (Fenofibrate) .Marland Kitchen.. 1 By Mouth Once Daily 3)  Nexium 40 Mg Cpdr (Esomeprazole Magnesium) .... Take 1 Capsule By Mouth Once A Day 4)  Synthroid 75 Mcg Tabs  (Levothyroxine Sodium) .Marland Kitchen.. 1 By Mouth Once Daily 5)  Multiple Vitamins   Tabs (Multiple Vitamin) .... Once Daily 6)  Simvastatin 10 Mg  Tabs (Simvastatin) .Marland Kitchen.. 1 Once Daily 7)  Metformin Hcl 500 Mg Tabs (Metformin Hcl) .Marland Kitchen.. 1 By Mouth Once Daily 8)  Benzonatate 100 Mg Caps (Benzonatate) .Marland Kitchen.. 1 By Mouth Three Times A Day As Needed Cough 9)  Nuvigil 150 Mg Tabs (Armodafinil) .Marland Kitchen.. 1 Daily As Needed 10)  Trazodone Hcl 50 Mg Tabs (Trazodone Hcl) .Marland Kitchen.. 1 For Sleep If Needed  Allergies (verified): 1)  Sulfamethoxazole (Sulfamethoxazole)  Past History:  Family History: Last updated: 09/23/2009 Family History of CAD Female 1st degree relative <60 Fam hx CHF Family History Diabetes 1st degree relative Family History Hypertension Family History Lung cancer Family History Other cancer-colon Family History of Arthritis Family History Depression Mother, DM, OSA, died of CHF Father- died lung ca.  Duaghter in rehab.    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 Drug use-no   Risk Factors: Alcohol Use: 0 (04/17/2010) Caffeine Use: 7 (09/23/2009) Exercise: yes (09/23/2009)  Risk Factors: Smoking Status: never (04/17/2010)  Past Medical History: Depression GERD Hyperlipidemia Dysmetabolic  HRT Liver cyst, hx of Osteoarthritis Childbirth x 1  Urinary incontinence Obstructive Sleep Apnea         mild NPSG- 07/30/09- AHI 4.8, RDI 15.4, Hx Malaria- from [place] Entamoeba histolytica liver abscess- dx'd HiPt 2011  Past Surgical History: Appendectomy Caesarean  section Hysterectomy Pilonidal Cyst Lap band - replaced 2011 Hiatal hernia- June 2011- Dr Ezzard Standing  Review of Systems      See HPI  The patient denies shortness of breath with activity, shortness of breath at rest, productive cough, non-productive cough, coughing up blood, chest pain, irregular heartbeats, acid heartburn, indigestion, loss of  appetite, weight change, abdominal pain, difficulty swallowing, sore throat, tooth/dental problems, headaches, nasal congestion/difficulty breathing through nose, and sneezing.    Vital Signs:  Patient profile:   55 year old female Menstrual status:  hysterectomy Height:      68 inches Weight:      229.25 pounds BMI:     34.98 O2 Sat:      96 % on Room air Pulse rate:   67 / minute BP sitting:   114 / 69  (right arm) Cuff size:   large  Vitals Entered By: Reynaldo Minium CMA (April 17, 2010 11:33 AM)  O2 Flow:  Room air   Physical Exam  Additional Exam:  General: A/Ox3; pleasant and cooperative, NAD, calm, SKIN: no rash, lesions NODES: no lymphadenopathy HEENT: [place]/AT, EOM- WNL, Conjuctivae- clear, PERRLA, TM-WNL, Nose- turbinate edema, Throat- clear and wnl, [place]amp[place]  III, small tonsils NECK: Supple w/ fair ROM, JVD- none, normal carotid impulses w/o bruits Thyroid- CHEST: Clear to P&A HEART: RRR, no m/g/r heard ABDOMEN: Soft and nl; She has Portacath right abdominal wall for fluid volume adjustment of her lap band. EAV:WUJW, nl pulses, no edema  NEURO: Grossly intact to observation, jiggling leg      Impression & Recommendations:  Problem # 1:  ? of OBSTRUCTIVE SLEEP APNEA (ICD-327.23)  She has only very mild sleep apnea. We discussed symptoms to watch for. The residual hypersomnia can be defined as ideopathic for now.  No longer needs Trazodone. Can continue good sleep hygiene and Nuvigil. Very complicated recent medical hx as outlined in HPI. She still is heavier than ideal, but I can't predict that more weight loss would improve sleep-disordered breathing by any specific amount.  Other Orders: Est. Patient Level III (11914)  Patient Instructions: 1)  Please schedule a follow-up appointment in 6 months. 2)  Nuvigil refilled Prescriptions: NUVIGIL 150 MG TABS (ARMODAFINIL) 1 daily as needed  #30 x 5   Entered and Authorized by:   Waymon Budge MD   Signed  by:   Waymon Budge MD on 04/17/2010   Method used:   Print then Give to Patient   RxID:   [id number]

## 2010-09-17 NOTE — Therapy (Signed)
Summary: Hearing Test/[facility]  Hearing Test/[facility]   Imported By: Maryln Gottron 08/11/2010 10:55:23  _____________________________________________________________________  External Attachment:    Type:   Image     Comment:   External Document

## 2010-09-17 NOTE — Medication Information (Signed)
Summary: Notice of Adverse Drug Interaction  Notice of Adverse Drug Interaction   Imported By: Maryln Gottron 08/13/2010 11:06:21  _____________________________________________________________________  External Attachment:    Type:   Image     Comment:   External Document

## 2010-09-17 NOTE — Assessment & Plan Note (Addendum)
Summary: cpx/cjr/ok per shannon/cjr   Vital Signs:  Patient profile:   55 year old female Menstrual status:  hysterectomy Height:      67 inches Weight:      232 pounds Pulse rate:   60 / minute BP sitting:   120 / 74  (right arm) Cuff size:   large  Vitals Entered By: Romualdo Bolk, CMA (AAMA) (July 28, 2010 2:26 PM) CC: CPX  Hearing Screen  20db HL: Left  500 hz: 20db 1000 hz: 15db 2000 hz: 10db 4000 hz: 15db Right  500 hz: 25db 1000 hz: 15db 2000 hz: 10db 4000 hz: 10db   Hearing Testing Entered By: Romualdo Bolk, CMA (AAMA) (July 28, 2010 3:20 PM)   History of Present Illness: Gloria [name] comes in today    for preventive visit .  Update for med issues since last check up an visit.  She has brought in a copy of her records op report and mri abd and health screening labs.   Since last visit she has had rx for amebic abscess felt  infected on her mission trips . and then had gerd with  hiatal herniation and into chest and required  repair with  replaced  lap band . Mood: stable on meds  LIPDS:  on emds no se n oted Hyperglycemia DM: on metformin without se .   seeing endocrinology  Dr Talmage Nap.   GERD: on meds   needs to suppress problem   Preventive Care Screening  Pap Smear:    Date:  10/14/2009    Results:  normal    Preventive Screening-Counseling & Management  Alcohol-Tobacco     Alcohol drinks/day: 0     Smoking Status: never  Caffeine-Diet-Exercise     Caffeine use/day: 7     Does Patient Exercise: yes     Type of exercise: Walking     Times/week: 3  Current Medications (verified): 1)  Citalopram Hydrobromide 40 Mg Tabs (Citalopram Hydrobromide) .Marland Kitchen.. 1 By Mouth Once Daily 2)  Fenofibrate 160 Mg Tabs (Fenofibrate) .Marland Kitchen.. 1 By Mouth Once Daily 3)  Nexium 40 Mg Cpdr (Esomeprazole Magnesium) .... Take 1 Capsule By Mouth Once A Day 4)  Synthroid 75 Mcg Tabs (Levothyroxine Sodium) .Marland Kitchen.. 1 By Mouth Once Daily 5)  Multiple Vitamins   Tabs  (Multiple Vitamin) .... Once Daily 6)  Lipitor 10 Mg Tabs (Atorvastatin Calcium) .Marland Kitchen.. 1 By Mouth Once Daily 7)  Metformin Hcl 500 Mg Tabs (Metformin Hcl) .Marland Kitchen.. 1 By Mouth Once Daily 8)  Benzonatate 100 Mg Caps (Benzonatate) .Marland Kitchen.. 1 By Mouth Three Times A Day As Needed Cough 9)  Trazodone Hcl 50 Mg Tabs (Trazodone Hcl) .Marland Kitchen.. 1 For Sleep If Needed  Allergies (verified): 1)  Sulfamethoxazole (Sulfamethoxazole)  Past History:  Past medical, surgical, family and social histories (including risk factors) reviewed, and no changes noted (except as noted below).  Past Medical History: Depression GERD Hyperlipidemia Dysmetabolic  HRT Liver cyst, hx of Osteoarthritis Childbirth x 1  Urinary incontinence Obstructive Sleep Apnea         mild NPSG- 07/30/09- AHI 4.8, RDI 15.4, Hx Malaria- from [place]  Entamoeba histolytica liver abscess- dx'd HiPt 2011  Hyperglycemia  fatty liver  Gallstone  Past Surgical History: Appendectomy Caesarean section Hysterectomy Pilonidal Cyst Lap band - replaced 2011 Hiatal hernia- June 2011- Dr Ezzard Standing Inguinal herniorrhaphy  Past History:  Care Management: Endocrinology: Horald Pollen Gastroenterology: Loreta Ave Gynecology: Lowell Guitar, [place] Orthopedics: Lajoyce Corners  Pulmonary: Young Surgery: Ezzard Standing Infectious Disease: Llibre-in the  past  Family History: Reviewed history from 09/23/2009 and no changes required. Family History of CAD Female 1st degree relative <60 Fam hx CHF Family History Diabetes 1st degree relative Family History Hypertension Family History Lung cancer Family History Other cancer-colon Family History of Arthritis Family History Depression Mother, DM, OSA, died of CHF Father- died lung ca.  Duaghter in rehab.    Social History: Reviewed history from 09/23/2009 and no changes required. 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    Review  of Systems  The patient denies anorexia, fever, hoarseness, chest pain, syncope, dyspnea on exertion, peripheral edema, prolonged cough, headaches, melena, hematochezia, hematuria, muscle weakness, transient blindness, unusual weight change, abnormal bleeding, enlarged lymph nodes, and angioedema.          no major cough     knees  bother her   no swelling.   needsto take ppi  hearing issue left ear off an on.  Physical Exam  General:  Well-developed,well-nourished,in no acute distress; alert,appropriate and cooperative throughout examination Head:  normocephalic and atraumatic.   Eyes:  PERRL, EOMs full, conjunctiva clear  Ears:  R ear normal, L ear normal, and no external deformities.  see hearing screen Nose:  no external deformity, no external erythema, and no nasal discharge.   Mouth:  good dentition and pharynx pink and moist.   Neck:  No deformities, masses, or tenderness noted. Breasts:  No mass, nodules, thickening, tenderness, bulging, retraction, inflamation, nipple discharge or skin changes noted.   Lungs:  Normal respiratory effort, chest expands symmetrically. Lungs are clear to auscultation, no crackles or wheezes.no dullness.   Heart:  Normal rate and regular rhythm. S1 and S2 normal without gallop, murmur, click, rub or other extra sounds.no lifts.   Abdomen:  Bowel sounds positive,abdomen soft and non-tender without masses, organomegaly or  noted.  well healed scars  Genitalia:  per gyne Msk:  no joint warmth and no redness over joints.   Pulses:  nl cap refill pulses intact without delay   Extremities:  no clubbing cyanosis or edema   varicose vein left leg superficail  no ulcer or redness Neurologic:  alert & oriented X3, strength normal in all extremities, and gait normal.  non focal  Pt is A&Ox3,affect,speech,memory,attention,&motor skills appear intact.  Skin:  turgor normal, color normal, no ecchymoses, and no petechiae.   Cervical Nodes:  No lymphadenopathy  noted Axillary Nodes:  No palpable lymphadenopathy Inguinal Nodes:  No significant adenopathy Psych:  Normal eye contact, appropriate affect. Cognition appears normal.  see labs  from health screening  ekg nsr done June 2011 Reviewed health records    brought by patient  Impression & Recommendations:  Problem # 1:  HEALTH MAINTENANCE EXAM, ADULT (ICD-V70.0) Discussed nutrition,exercise,diet,healthy weight, vitamin D and calcium.    i think the leg area is a superficial vein  observe and recheck a s needed   Problem # 2:  HYPERGLYCEMIA (ICD-790.29) hg a1c  reported ok  5.6   Her updated medication list for this problem includes:    Metformin Hcl 500 Mg Tabs (Metformin hcl) .Marland Kitchen... 1 by mouth once daily  Labs Reviewed: Creat: 0.8 (06/10/2009)     Problem # 3:  DEGENERATIVE JOINT DISEASE, KNEE (ICD-715.96) problematic   Problem # 4:  OBESITY (ICD-278.00)  Ht: 67 (07/28/2010)   Wt: 232 (07/28/2010)  BMI: 34.98 (04/17/2010)  Problem # 5:  GERD (ICD-530.81) Her updated medication list for this problem includes:    Nexium 40 Mg Cpdr (Esomeprazole magnesium) .Marland Kitchen... Take 1 capsule by mouth once a day  Problem # 6:  DEPRESSION (ICD-311) per psych Her updated medication list for this problem includes:    Citalopram Hydrobromide 40 Mg Tabs (Citalopram hydrobromide) .Marland Kitchen... 1 by mouth once daily    Trazodone Hcl 50 Mg Tabs (Trazodone hcl) .Marland Kitchen... 1 for sleep if needed  Problem # 7:  HYPERLIPIDEMIA (ICD-272.4) per endo  Her updated medication list for this problem includes:    Fenofibrate 160 Mg Tabs (Fenofibrate) .Marland Kitchen... 1 by mouth once daily    Lipitor 10 Mg Tabs (Atorvastatin calcium) .Marland Kitchen... 1 by mouth once daily  Labs Reviewed: SGOT: 31 (06/10/2009)   SGPT: 20 (06/10/2009)   HDL:57.10 (06/10/2009), 59.0 (12/19/2006)  LDL:61 (06/10/2009), 86 (12/19/2006)  Chol:136 (06/10/2009), 174 (12/19/2006)  Trig:91.0 (06/10/2009), 143 (12/19/2006)  Problem # 8:  BARIATRIC SURGERY STATUS LAP BAND   2007 (ICD-V45.86) Assessment: Comment Only  Problem # 9:  PROBLEMS WITH HEARING (ICD-V41.2) Assessment: New screen  today  normal     left ear symptoms   monitor  .   see ent if persistent or  progressive   nl exam today.   Problem # 10:  HYPOTHYROIDISM (ICD-244.9) per dr Talmage Nap  see nl health screen   tsh   1.5  Her updated medication list for this problem includes:    Synthroid 75 Mcg Tabs (Levothyroxine sodium) .Marland Kitchen... 1 by mouth once daily  Complete Medication List: 1)  Citalopram Hydrobromide 40 Mg Tabs (Citalopram hydrobromide) .Marland Kitchen.. 1 by mouth once daily 2)  Fenofibrate 160 Mg Tabs (Fenofibrate) .Marland Kitchen.. 1 by mouth once daily 3)  Nexium 40 Mg Cpdr (Esomeprazole magnesium) .... Take 1 capsule by mouth once a day 4)  Synthroid 75 Mcg Tabs (Levothyroxine sodium) .Marland Kitchen.. 1 by mouth once daily 5)  Multiple Vitamins Tabs (Multiple vitamin) .... Once daily 6)  Lipitor 10 Mg Tabs (Atorvastatin calcium) .Marland Kitchen.. 1 by mouth once daily 7)  Metformin Hcl 500 Mg Tabs (Metformin hcl) .Marland Kitchen.. 1 by mouth once daily 8)  Benzonatate 100 Mg Caps (Benzonatate) .Marland Kitchen.. 1 by mouth three times a day as needed cough 9)  Trazodone Hcl 50 Mg Tabs (Trazodone hcl) .Marland Kitchen.. 1 for sleep if needed  Patient Instructions: 1)  continue weight loss  2)  No change in  meds    3)  follow up with her specialist . 4)  Yearly check or as appropriate   Orders Added: 1)  Est. Patient 40-64 years [99396] 2)  Est. Patient Level III [95621]   Immunization History:  Influenza Immunization History:    Influenza:  fluvax 3+ (06/11/2010)   Immunization History:  Influenza Immunization History:    Influenza:  Fluvax 3+ (06/11/2010)

## 2010-09-17 NOTE — Letter (Signed)
Summary: Records from [facility] 10/21/09 - 11/24/09  Records from [facility] 10/21/09 - 11/24/09   Imported By: Maryln Gottron 08/11/2010 10:53:02  _____________________________________________________________________  External Attachment:    Type:   Image     Comment:   External Document

## 2010-09-17 NOTE — Assessment & Plan Note (Signed)
Summary: ROA/FUP/RCD   Vital Signs:  Patient profile:   55 year old female Menstrual status:  hysterectomy Weight:      210 pounds Pulse rate:   66 / minute BP sitting:   110 / 70  (right arm) Cuff size:   regular  Vitals Entered By: Romualdo Bolk, CMA (AAMA) (September 23, 2009 8:13 AM) CC: Follow-up visit on meds- Nexium is not working   History of Present Illness: Gloria [name] comesin today for above  gerd signs in the setting of lap band and other medical problems. Since last visit she has seen Dr Maple Hudson who began low dose Nuvigil which has helped a good bit.  Reflux symptom are continuing esp at night  Has lost   some weight   band adjusted.  Thinks it may be aggravating her reflux   taking nexium  no relationship to eating  with break through nocturnal signs .  Weight loss as expected. No vomiting diarrhea.  BP ok .  OA  no change . Sleep apnea with hypersomnia .  Psych : stable  per Dr Jennelle Human. LIPIDs: no se of meds .   Preventive Screening-Counseling & Management  Alcohol-Tobacco     Alcohol drinks/day: 0     Smoking Status: never  Caffeine-Diet-Exercise     Caffeine use/day: 7     Does Patient Exercise: yes     Type of exercise: Walking     Times/week: 3  Current Medications (verified): 1)  Citalopram Hydrobromide 40 Mg Tabs (Citalopram Hydrobromide) .Marland Kitchen.. 1 By Mouth Once Daily 2)  Estrace 0.5 Mg Tabs (Estradiol) .Marland Kitchen.. 1 By Mouth Once Daily 3)  Fenofibrate 160 Mg Tabs (Fenofibrate) .Marland Kitchen.. 1 By Mouth Once Daily 4)  Nexium 40 Mg Cpdr (Esomeprazole Magnesium) .... Take 1 Capsule By Mouth Once A Day 5)  Synthroid 75 Mcg Tabs (Levothyroxine Sodium) .Marland Kitchen.. 1 By Mouth Once Daily 6)  Multiple Vitamins   Tabs (Multiple Vitamin) .... Once Daily 7)  Simvastatin 10 Mg  Tabs (Simvastatin) .Marland Kitchen.. 1 Once Daily 8)  Metformin Hcl 500 Mg Tabs (Metformin Hcl) .Marland Kitchen.. 1 By Mouth Once Daily 9)  Benzonatate 100 Mg Caps (Benzonatate) .Marland Kitchen.. 1 By Mouth Three Times A Day As Needed Cough 10)   Nuvigil 150 Mg Tabs (Armodafinil) .Marland Kitchen.. 1 Daily As Needed 11)  Trazodone Hcl 50 Mg Tabs (Trazodone Hcl) .Marland Kitchen.. 1 For Sleep If Needed  Allergies (verified): 1)  Sulfamethoxazole (Sulfamethoxazole)  Past History:  Past medical, surgical, family and social histories (including risk factors) reviewed, and no changes noted (except as noted below).  Past Medical History: Reviewed history from 08/18/2009 and no changes required. Depression GERD Hyperlipidemia Dysmetabolic  HRT Liver cyst, hx of Osteoarthritis Childbirth x 1  Urinary incontinence NPSG- 07/30/09- AHI 4.8, RDI 15.4,  Past Surgical History: Appendectomy Caesarean section Hysterectomy Pilonidal Cyst Lap band   Past History:  Care Management: Endocrinology: Horald Pollen Gastroenterology: Loreta [address] Gynecology: Lowell Guitar, [place] Orthopedics: Lajoyce Corners  Pulmonary: Young  Family History: Family History of CAD Female 1st degree relative <60 Fam hx CHF Family History Diabetes 1st degree relative Family History Hypertension Family History Lung cancer Family History Other cancer-colon Family History of Arthritis Family History Depression Mother, DM, OSA, died of CHF Father- died lung ca.  Duaghter in rehab.    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    Physical Exam  General:  Well-developed,well-nourished,in no acute distress; alert,appropriate and cooperative throughout examination Head:  normocephalic and atraumatic.   Eyes:  vision grossly intact, pupils equal, and pupils round.   Ears:  R ear normal, L ear normal, and no external  deformities.   Neck:  No deformities, masses, or tenderness noted. Lungs:  Normal respiratory effort, chest expands symmetrically. Lungs are clear to auscultation, no crackles or wheezes.no dullness.   Heart:  Normal rate and regular rhythm. S1 and S2 normal without gallop, murmur, click, rub or other extra sounds.no lifts.   Abdomen:  Bowel sounds positive,abdomen soft and non-tender without masses, organomegaly or  noted.  well healed scars  Pulses:  nl cap refill  Extremities:  no clubbing cyanosis or edema  Neurologic:  alert & oriented X3, strength normal in all extremities, and gait normal.   Skin:  turgor normal, color normal, no ecchymoses, no petechiae, and no purpura.   Cervical Nodes:  No lymphadenopathy noted Psych:  Oriented X3, normally interactive, good eye contact, not anxious appearing, and not depressed appearing.     Impression & Recommendations:  Problem # 1:  GERD (ICD-530.81) break through   disc taking med pre meal to maintain effectiveness  can try switching   and then assess response  she is going to  have the band deflated some to see if that helps also . continue weight loss.   samples given  15 # Her updated medication list for this problem includes:    Nexium 40 Mg Cpdr (Esomeprazole magnesium) .Marland Kitchen... Take 1 capsule by mouth once a day    Dexilant 60 Mg Cpdr (Dexlansoprazole) .Marland Kitchen... 1 by mouth once daily  Problem # 2:  OBESITY (ICD-278.00) Assessment: Improved disc association with sleep and  obesity   Problem # 3:  HYPERLIPIDEMIA (ICD-272.4)  Her updated medication list for this problem includes:    Fenofibrate 160 Mg Tabs (Fenofibrate) .Marland Kitchen... 1 by mouth once daily    Simvastatin 10 Mg Tabs (Simvastatin) .Marland Kitchen... 1 once daily  Labs Reviewed: SGOT: 31 (06/10/2009)   SGPT: 20 (06/10/2009)   HDL:57.10 (06/10/2009), 59.0 (12/19/2006)  LDL:61 (06/10/2009), 86 (12/19/2006)  Chol:136 (06/10/2009), 174 (12/19/2006)  Trig:91.0 (06/10/2009), 143 (12/19/2006)  Problem  # 4:  DEPRESSION (ICD-311) Assessment: Improved  Her updated medication list for this problem includes:    Citalopram Hydrobromide 40 Mg Tabs (Citalopram hydrobromide) .Marland Kitchen... 1 by mouth once daily    Trazodone Hcl 50 Mg Tabs (Trazodone hcl) .Marland Kitchen... 1 for sleep if needed  Problem # 5:  ? of OBSTRUCTIVE SLEEP APNEA (ICD-327.23) mild with hypersomnia Assessment: Comment Only  Complete Medication List: 1)  Citalopram Hydrobromide 40 Mg Tabs (Citalopram hydrobromide) .Marland Kitchen.. 1 by mouth once daily 2)  Estrace 0.5 Mg Tabs (Estradiol) .Marland Kitchen.. 1 by mouth once daily 3)  Fenofibrate 160 Mg Tabs (Fenofibrate) .Marland Kitchen.. 1 by mouth once daily 4)  Nexium 40 Mg Cpdr (Esomeprazole magnesium) .... Take 1 capsule by mouth once a day 5)  Synthroid 75 Mcg Tabs (Levothyroxine sodium) .Marland Kitchen.. 1 by mouth once daily 6)  Multiple Vitamins Tabs (Multiple vitamin) .... Once daily 7)  Simvastatin 10 Mg Tabs (Simvastatin) .Marland Kitchen.. 1 once daily 8)  Metformin Hcl 500 Mg Tabs (Metformin hcl) .Marland Kitchen.. 1 by mouth once daily 9)  Benzonatate 100 Mg Caps (Benzonatate) .Marland Kitchen.. 1 by mouth three times a  day as needed cough 10)  Nuvigil 150 Mg Tabs (Armodafinil) .Marland Kitchen.. 1 daily as needed 11)  Trazodone Hcl 50 Mg Tabs (Trazodone hcl) .Marland Kitchen.. 1 for sleep if needed 12)  Dexilant 60 Mg Cpdr (Dexlansoprazole) .Marland Kitchen.. 1 by mouth once daily  Patient Instructions: 1)  change    ppi t and take 30-60 minutes before  meal. 2)  call in 2-4 weeks about your reflux status and medication plan.  3)  Otherwise check at [facility] in   the fall with labs.  Prescriptions: DEXILANT 60 MG CPDR (DEXLANSOPRAZOLE) 1 by mouth once daily  #30 x 3   Entered and Authorized by:   Madelin Headings MD   Signed by:   Madelin Headings MD on 09/23/2009   Method used:   Print then Give to Patient   RxID:   [id number]

## 2010-09-17 NOTE — Progress Notes (Signed)
Summary: nos appt  Phone Note Call from Patient   Caller: [email or web address]  Call For: young Summary of Call: Rsc nos from 8/2 to 9/2 @ 11a. Initial call taken by: Darletta Moll,  March 18, 2010 3:08 PM

## 2010-09-17 NOTE — Progress Notes (Signed)
Summary: REFILL REQUEST (Nexium)  Phone Note Refill Request Message from:  Patient on July 03, 2010 9:00 AM  Refills Requested: Medication #1:  NEXIUM 40 MG CPDR Take 1 capsule by mouth once a day   Notes: [facility]    Initial call taken by: Debbra Riding,  July 03, 2010 9:00 AM  Follow-up for Phone Call        Rx sent  Follow-up by: Romualdo Bolk, CMA (AAMA),  July 03, 2010 9:12 AM    Prescriptions: NEXIUM 40 MG CPDR (ESOMEPRAZOLE MAGNESIUM) Take 1 capsule by mouth once a day  #90 Capsule x 0   Entered by:   Romualdo Bolk, CMA (AAMA)   Authorized by:   Madelin Headings MD   Signed by:   Romualdo Bolk, CMA (AAMA) on 07/03/2010   Method used:   Electronically to        [facility]* (retail)       [address]       [address] [employer]       [place], [place]  [address]       Ph: [phone]       Fax: [phone]   RxID:   [id number]

## 2010-09-17 NOTE — Progress Notes (Signed)
Summary: rx nuvigil  Phone Note Call from Patient   Caller: Patient Call For: Hurschel Paynter Summary of Call: pt states that the nuvigil is working "great". says she has 1/2 tab remaining. if she can get a rx for 30 days at a time, she would be able to get "50%" off as she has a card for this. [facility]. pt ph# [phone] Initial call taken by: Tivis Ringer, CNA,  September 08, 2009 11:18 AM  Follow-up for Phone Call        Pt was seen by CY on 1/3/111 and given sample of Nuvigil 150 take 1/2 -1 tab in am as needed.  Will forward to CY-please advise if ok to send rx to pharm.  Thanks!  Follow-up by: Gweneth Dimitri RN,  September 08, 2009 11:23 AM  Additional Follow-up for Phone Call Additional follow up Details #1::        I've put Nuvigil on med list. Please send. Additional Follow-up by: Waymon Budge MD,  September 08, 2009 12:47 PM    Additional Follow-up for Phone Call Additional follow up Details #2::    Rx sent. pt aware. Carron Curie CMA  September 08, 2009 2:35 PM   New/Updated Medications: NUVIGIL 150 MG TABS (ARMODAFINIL) 1 daily as needed Prescriptions: NUVIGIL 150 MG TABS (ARMODAFINIL) 1 daily as needed  #30 x 5   Entered and Authorized by:   Waymon Budge MD   Signed by:   Waymon Budge MD on 09/08/2009   Method used:   Historical   RxID:   [id number]

## 2010-09-17 NOTE — Assessment & Plan Note (Signed)
Summary: rov after sleep study//kp   Primary Provider/Referring Provider:  Panosh  CC:  Follow up to discuss sleep study results.  No complaints. .  History of Present Illness: July 17, 2009-  55yoF seen at kind request of Dr Fabian Sharp because of sleep problems. She had a sleep study 07/23/02 with very loud snoring but normal range RI of 2/hour.She was given a trial of Vivactil and referred for discusion of oral appliance, then lost to folow up. In recent years she has noted waking repeatedly, nonrestorative sleep, falling asleep at her desk. [place] she had taken Ambien to help sleep on the plane. On arrival, her roomate told her she had sleep apnea.  Bedtime 9-930AM, short sleep latency, waking 2-3 times briefly before up at 530AM. She reports losing 70-80 lbs in past 2 years. Takes occasional trazodone for sleep. Some coffee and tea in the morning. Legs may ache at night but no kicking or jerking.  August 18, 2009 OSA Returns for f/u after sleep study.  Very hard cough paroxyxms in sleep since backfrom [place]. Not relieved by nexium/ pepcid. discussed HOB elevation. This cough does fragment sleep some, but not clear that it is the sufficient reason for her compalint of tiredness. She doesn't dexscribe caaplexy or sleepparalysis. \ she was noted to have some apneas in [place] after Ambien. NPSG- AHI 4.8, RDI 15.4. I told her this would not ordinarily be enough to make [place] try CPAP initially. Maybe an oral appliance.   Current Medications (verified): 1)  Citalopram Hydrobromide 40 Mg Tabs (Citalopram Hydrobromide) .Marland Kitchen.. 1 By Mouth Once Daily 2)  Estrace 0.5 Mg Tabs (Estradiol) .Marland Kitchen.. 1 By Mouth Once Daily 3)  Fenofibrate 160 Mg Tabs (Fenofibrate) .Marland Kitchen.. 1 By Mouth Once Daily 4)  Nexium 40 Mg Cpdr (Esomeprazole Magnesium) .... Take 1 Capsule By Mouth Once A Day 5)  Synthroid 75 Mcg Tabs (Levothyroxine Sodium) .Marland Kitchen.. 1 By Mouth Once Daily 6)  Multiple Vitamins   Tabs (Multiple  Vitamin) .... Once Daily 7)  Simvastatin 10 Mg  Tabs (Simvastatin) .Marland Kitchen.. 1 Once Daily 8)  Metformin Hcl 500 Mg Tabs (Metformin Hcl) .Marland Kitchen.. 1 By Mouth Once Daily 9)  Benzonatate 100 Mg Caps (Benzonatate) .Marland Kitchen.. 1 By Mouth Three Times A Day As Needed Cough  Allergies (verified): 1)  Sulfamethoxazole (Sulfamethoxazole)  Past History:  Past Surgical History: Last updated: 04/12/2007 Appendectomy Caesarean section Hysterectomy Pilonidal Cyst  Family History: Last updated: 07/17/2009 Family History of CAD Female 1st degree relative <60 Fam hx CHF Family History Diabetes 1st degree relative Family History Hypertension Family History Lung cancer Family History Other cancer-colon Family History of Arthritis Family History Depression Mother, DM, OSA, died of CHF Father- died lung ca.  Duaghter in rehab.   Social History: Last updated: 07/17/2009 Never Smoked Alcohol use-no Occupation: Charity fundraiser  works days  Divorced/ lives alone Drug use-no  Risk Factors: Alcohol Use: 0 (06/17/2009) Caffeine Use: 7 (06/17/2009) Exercise: yes (06/17/2009)  Risk Factors: Smoking Status: never (06/17/2009)  Past Medical History: Depression GERD Hyperlipidemia Dysmetabolic  HRT Liver cyst, hx of Osteoarthritis Childbirth x 1  Urinary incontinence NPSG- 07/30/09- AHI 4.8, RDI 15.4,  Review of Systems      See HPI  The patient denies anorexia, fever, weight loss, weight gain, vision loss, decreased hearing, hoarseness, chest pain, syncope, dyspnea on exertion, peripheral edema, prolonged cough, headaches, hemoptysis, abdominal pain, and severe indigestion/heartburn.    Vital Signs:  Patient profile:   55 year old  female Menstrual status:  hysterectomy Height:      68 inches Weight:      220.38 pounds BMI:     33.63 O2 Sat:      99 % on Room air Pulse rate:   57 / minute BP sitting:   106 / 70  (right arm) Cuff size:   regular  Vitals Entered By: Gweneth Dimitri RN (August 18, 2009 4:38  PM)  O2 Flow:  Room air CC: Follow up to discuss sleep study results.  No complaints.  Comments Medications reviewed with patient Gweneth Dimitri RN  August 18, 2009 4:38 PM    Physical Exam  Additional Exam:  General: A/Ox3; pleasant and cooperative, NAD, calm, SKIN: no rash, lesions NODES: no lymphadenopathy HEENT: [place]/AT, EOM- WNL, Conjuctivae- clear, PERRLA, TM-WNL, Nose- turbinate edema, Throat- clear and wnl, [place]  III, small tonsils NECK: Supple w/ fair ROM, JVD- none, normal carotid impulses w/o bruits Thyroid- CHEST: Clear to P&A HEART: RRR, no m/g/r heard ABDOMEN: Soft and nl;  EAV:WUJW, nl pulses, no edema  NEURO: Grossly intact to observation      Impression & Recommendations:  Problem # 1:  ? of OBSTRUCTIVE SLEEP APNEA (ICD-327.23)  Excessive daytime somnolence.  Sleep is punctuated by cough suggestive of reflux. We will have her elevate HOB on bricks. Try samples Nuvigil 150. Consider caffeine tabs.  Medications Added to Medication List This Visit: 1)  Multiple Vitamins Tabs (Multiple vitamin) .... Once daily  Other Orders: Est. Patient Level II (11914)  Patient Instructions: 1)  Please schedule a follow-up appointment in 1 month. 2)  Try elevating head of bed on brick under each head leg. We hope to reduce reflux if that is causing yuour nocturnal cough. 3)  Samples Nuvigil 150: 1/2 - 1 in AM as needed.

## 2010-09-17 NOTE — Letter (Signed)
Summary: Health Screening Report  Health Screening Report   Imported By: Maryln Gottron 08/13/2010 11:08:36  _____________________________________________________________________  External Attachment:    Type:   Image     Comment:   External Document

## 2010-09-23 NOTE — Letter (Signed)
Summary: [facility]   Imported By: Maryln Gottron 09/14/2010 12:59:14  _____________________________________________________________________  External Attachment:    Type:   Image     Comment:   External Document

## 2010-09-24 ENCOUNTER — Other Ambulatory Visit: Payer: Self-pay | Admitting: Internal Medicine

## 2010-10-22 DIAGNOSIS — G4733 Obstructive sleep apnea (adult) (pediatric): Secondary | ICD-10-CM | POA: Insufficient documentation

## 2010-11-02 LAB — COMPREHENSIVE METABOLIC PANEL
AST: 30 U/L (ref 0–37)
BUN: 11 mg/dL (ref 6–23)
CO2: 26 mEq/L (ref 19–32)
Chloride: 101 mEq/L (ref 96–112)
Creatinine, Ser: 0.85 mg/dL (ref 0.4–1.2)
GFR calc Af Amer: >60 mL/min (ref >60)
GFR calc non Af Amer: >60 mL/min (ref >60)
Total Bilirubin: 0.4 mg/dL (ref 0.3–1.2)

## 2010-11-02 LAB — DIFFERENTIAL
Eosinophils Relative: 0 % (ref 0–5)
Eosinophils Relative: 4 % (ref 0–5)
Lymphocytes Relative: 32 % (ref 12–46)
Lymphocytes Relative: 9 % — ABNORMAL LOW (ref 12–46)
Lymphs Abs: 0.9 10*3/uL (ref 0.7–4.0)
Lymphs Abs: 2.3 10*3/uL (ref 0.7–4.0)
Monocytes Relative: 8 % (ref 3–12)
Neutrophils Relative %: 86 % — ABNORMAL HIGH (ref 43–77)

## 2010-11-02 LAB — CBC
HCT: 33.7 % — ABNORMAL LOW (ref 36.0–46.0)
HCT: 36.8 % (ref 36.0–46.0)
Hemoglobin: 12 g/dL (ref 12.0–15.0)
MCV: 87.8 fL (ref 78.0–100.0)
Platelets: 226 10*3/uL (ref 150–400)
RBC: 3.83 MIL/uL — ABNORMAL LOW (ref 3.87–5.11)
RBC: 4.19 MIL/uL (ref 3.87–5.11)
WBC: 7.2 10*3/uL (ref 4.0–10.5)
WBC: 9.7 10*3/uL (ref 4.0–10.5)

## 2010-11-02 LAB — GLUCOSE, CAPILLARY
Glucose-Capillary: 107 mg/dL — ABNORMAL HIGH (ref 70–99)
Glucose-Capillary: 110 mg/dL — ABNORMAL HIGH (ref 70–99)
Glucose-Capillary: 118 mg/dL — ABNORMAL HIGH (ref 70–99)
Glucose-Capillary: 142 mg/dL — ABNORMAL HIGH (ref 70–99)

## 2010-11-03 ENCOUNTER — Ambulatory Visit: Payer: Self-pay | Admitting: Internal Medicine

## 2010-11-04 LAB — POCT I-STAT, CHEM 8
Hemoglobin: 14.3 g/dL (ref 12.0–15.0)
Sodium: 136 mEq/L (ref 135–145)
TCO2: 25 mmol/L (ref 0–100)

## 2010-12-29 ENCOUNTER — Other Ambulatory Visit (HOSPITAL_COMMUNITY): Payer: Self-pay | Admitting: Obstetrics and Gynecology

## 2010-12-29 DIAGNOSIS — Z1231 Encounter for screening mammogram for malignant neoplasm of breast: Secondary | ICD-10-CM

## 2011-01-01 NOTE — Op Note (Signed)
NAMEWENDELIN, Gloria [name]              ACCOUNT NO.:  [id number]   MEDICAL RECORD NO.:  [id number]          PATIENT TYPE:  AMB   LOCATION:  ENDO                         FACILITY:  [facility]   PHYSICIAN:  Anselmo Rod, M.D.  DATE OF BIRTH:  03-26-1956   DATE OF PROCEDURE:  11/24/2005  DATE OF DISCHARGE:                                 OPERATIVE REPORT   PROCEDURE PERFORMED:  Colonoscopy with cold biopsies x4.   ENDOSCOPIST:  Anselmo Rod, M.D.   INSTRUMENT USED:  Olympus video colonoscope.   INDICATION FOR PROCEDURE:  A 55 year old white female with a family history  of colon cancer in the paternal grandfather, undergoing screening  colonoscopy.  Rule out colonic polyps, masses, etc.   PREPROCEDURE PREPARATION:  Informed consent was procured from the patient.  The patient fasted for four hours prior to the procedure and prepped with  OsmoPrep pills the night of and in the morning of the procedure.  The risks  and benefits of the procedure, including a 10% miss rate of cancer and  polyps, were discussed with the patient as well.   PREPROCEDURE PHYSICAL:  VITAL SIGNS:  The patient had stable vital signs.  NECK:  Supple.  CHEST:  Clear to auscultation.  S1, S2 regular.  ABDOMEN:  Soft with normal bowel sounds.   DESCRIPTION OF PROCEDURE:  The patient was placed in the left lateral  decubitus position and sedated with 60 mcg of fentanyl and 7.5 mg of Versed  in slow incremental doses.  Once the patient was adequately sedate and  maintained on low-flow oxygen and continuous cardiac monitoring, the Olympus  video colonoscope was advanced from the rectum to the cecum.  There was some  residual stool in the colon.  Multiple washes were done.  The appendiceal  orifice and the ileocecal valve were visualized and photographed.  The  terminal ileum appeared healthy and without lesions.  A small polyp was seen  at the cecal base and the appendiceal orifice, which was biopsied x4 (cold  biopsied x4).  The rest of the exam was unremarkable.  No masses or polyps  were identified.  There was no evidence of diverticulosis.  Small internal  hemorrhoids were seen on retroflexion in the rectum.  The patient tolerated  the procedure well without immediate complications.   IMPRESSION:  1.  Small sessile polyp biopsied from the cecal base.  2.  Small internal hemorrhoids.  3.  Otherwise normal exam up to the terminal ileum, no evidence of      diverticulosis.   RECOMMENDATIONS:  1.  Await pathology results.  2.  Avoid all nonsteroidals for the next four weeks.  3.  Repeat colonoscopy depending on pathology results.  4.  Outpatient follow-up as the need arises in the future.      Anselmo Rod, M.D.  Electronically Signed     JNM/MEDQ  D:  11/24/2005  T:  11/24/2005  Job:  [id number]   cc:   Neta Mends. Fabian Sharp, M.D. [facility]  [address] [place]  [place] [address]   Dorisann Frames, M.D.  Fax:  [phone]   Lorne Skeens Hoxworth, M.D.  [address]  [place]  [place] [address]

## 2011-01-01 NOTE — Op Note (Signed)
Gloria [name], [name]              ACCOUNT NO.:  [id number]   MEDICAL RECORD NO.:  [id number]          PATIENT TYPE:  OIB   LOCATION:  1516                         FACILITY:  [facility]   PHYSICIAN:  Sandria Bales. Ezzard Standing, M.D.  DATE OF BIRTH:  [birth year]   DATE OF PROCEDURE:  04/26/2005  DATE OF DISCHARGE:                                 OPERATIVE REPORT   PREOPERATIVE DIAGNOSIS:  Morbid obesity with original weight of 263, BMI  41.3.   POSTOPERATIVE DIAGNOSIS:  Morbid obesity with original weight of 263, BMI  41.3.   PROCEDURE:  Laparoscopic adjustable gastric banding (#10 Band).   SURGEON:  Sandria Bales. Ezzard Standing, M.D.   FIRST ASSISTANT:  Sharlet Salina T. Hoxworth, M.D.   ANESTHESIA:  General with approximately 20 cc of 4% Marcaine.   COMPLICATIONS:  None.   INDICATIONS FOR PROCEDURE:  Gloria [name] is a 55 year old white female who  has been morbidly obese much of her adult life and has had multiple problems  with her obesity.  Her initial weight was 263 (BMI 41.3)  She has been  through a preoperative program which has included labs, diagnostic tests,  nutritional evaluation, psych evaluation, and now comes for attempted  laparoscopic banding.   Patient understands the indications and potential risks of banding.  In  particular, risks include but are not limited to bleeding, infection,  perforation of the bowel, slippage of the band, erosion of the band, and  long-term nutritional and dietary consequences.   OPERATIVE NOTE:  Patient was placed in the supine position and underwent a  general endotracheal anesthetic.  She was given 1 gm of Ancef at the  initiation of the procedure.  Abdomen was prepped with Betadine solution and  sterilely draped.  I accessed her abdomen by using a 10 mm Optiview in the  left upper quadrant and inserted a 0 degree scope into the abdomen.  I then  placed four additional abdominal ports of 5 mm and then a subxiphoid port  from her liver.  I placed the left  lateral 5 mm port, a left paramedian 10  mm port, a right paramedian 12 mm port, and a right subcostal 10 mm port.   Abdominal exploration revealed both right and left lobes of the liver had  fatty infiltration of the liver, which had already been documented by  ultrasound preoperatively.  She has adhesions of the lower abdomen from  where her omentum had been stuck up.  Her gallbladder looked grossly normal.  Her stomach looked grossly normal.  I then grasped the stomach, identified  the angle of His, made a small hole along the greater curvature of the  gastroesophageal junction along the angle of His.  Then I opened the  gastrohepatic ligament and found the right crus.  Made a small incision  behind the stomach and then passed our finger dilator behind the stomach.  I  positioned this well.   I then introduced a #10 adjustable band into the abdominal cavity, passed  this around the stomach.  I used the sizers through the stomach.  I then  cinched this down.  The band was not too tight.  She did have some fat along  her gastroesophageal junction, but I did not think there was an excess  amount of fat.   I then used three sutures of 2-0 Ethibond suture, to wrap the stomach over  the band onto the esophagus.  Tied these knots with Ty-Knot device.   I left about 1 cm of suture on each Ty-Knot.  I then irrigated the upper  abdomen.  I visualized the band which lay well.  It was mobile.  There was  no impingement on a bucket handle, that I could tell.  I then, through the  right paramedian incision, brought out the tip of the catheter.   I then removed the liver retractor, which was the [facility] retractor from  around the liver.  Under direct visualization, I removed the trocars, and  there was no bleeding at the trocar sites.  I then enlarged my hole in the  right paramedian incision and placed the reservoir of the band, sewed it in  with four 2-0 Prolene sutures, and this laid down  flat.   I then closed each wound with a 5-0 Monocryl sutured, painted the wound with  tincture of benzoin and steri-stripped it.   The patient tolerated the procedure well and was transported to the recovery  room in good condition.  Sponge and needle counts were correct at the end of  the case.      Sandria Bales. Ezzard Standing, M.D.  Electronically Signed     DHN/MEDQ  D:  04/26/2005  T:  04/26/2005  Job:  [id number]   cc:   Neta Mends. Fabian Sharp, M.D. [facility]  [address] [place]  [place] [address]   Dorisann Frames, M.D.  [phone] [address] [place] [address]  Fax: [phone]   Nadara Mustard, MD  [address] [place]  [place] [address]  Fax: [phone]

## 2011-01-07 ENCOUNTER — Ambulatory Visit (HOSPITAL_COMMUNITY)
Admission: RE | Admit: 2011-01-07 | Discharge: 2011-01-07 | Disposition: A | Discharge status: Home / Self Care | Payer: 59 | Source: Ambulatory Visit | Attending: Obstetrics and Gynecology | Admitting: Obstetrics and Gynecology

## 2011-01-07 DIAGNOSIS — Z1231 Encounter for screening mammogram for malignant neoplasm of breast: Secondary | ICD-10-CM | POA: Insufficient documentation

## 2011-01-12 ENCOUNTER — Other Ambulatory Visit: Payer: Self-pay | Admitting: Obstetrics and Gynecology

## 2011-01-12 DIAGNOSIS — R928 Other abnormal and inconclusive findings on diagnostic imaging of breast: Secondary | ICD-10-CM

## 2011-01-19 ENCOUNTER — Ambulatory Visit
Admission: RE | Admit: 2011-01-19 | Discharge: 2011-01-19 | Disposition: A | Discharge status: Home / Self Care | Payer: 59 | Source: Ambulatory Visit | Attending: Obstetrics and Gynecology | Admitting: Obstetrics and Gynecology

## 2011-01-19 ENCOUNTER — Other Ambulatory Visit: Payer: Self-pay | Admitting: Obstetrics and Gynecology

## 2011-01-19 DIAGNOSIS — R928 Other abnormal and inconclusive findings on diagnostic imaging of breast: Secondary | ICD-10-CM

## 2011-01-25 ENCOUNTER — Ambulatory Visit
Admission: RE | Admit: 2011-01-25 | Discharge: 2011-01-25 | Disposition: A | Discharge status: Home / Self Care | Payer: 59 | Source: Ambulatory Visit | Attending: Obstetrics and Gynecology | Admitting: Obstetrics and Gynecology

## 2011-01-25 ENCOUNTER — Other Ambulatory Visit: Payer: Self-pay | Admitting: Radiology

## 2011-01-25 ENCOUNTER — Other Ambulatory Visit: Payer: Self-pay | Admitting: Obstetrics and Gynecology

## 2011-01-25 DIAGNOSIS — R928 Other abnormal and inconclusive findings on diagnostic imaging of breast: Secondary | ICD-10-CM

## 2011-03-11 ENCOUNTER — Encounter (INDEPENDENT_AMBULATORY_CARE_PROVIDER_SITE_OTHER): Payer: Self-pay | Admitting: Surgery

## 2011-05-13 LAB — CBC
Platelets: 227
WBC: 8.1

## 2011-05-13 LAB — MISCELLANEOUS TEST: Miscellaneous Test Results: NEGATIVE

## 2011-05-13 LAB — POCT URINALYSIS DIP (DEVICE)
Operator id: 247071
Protein, ur: NEGATIVE
Urobilinogen, UA: 1

## 2011-05-13 LAB — DIFFERENTIAL
Eosinophils Absolute: 0.2
Lymphocytes Relative: 21
Lymphs Abs: 1.7
Neutrophils Relative %: 67

## 2011-05-13 LAB — MALARIA SMEAR

## 2011-05-13 LAB — POCT I-STAT, CHEM 8
BUN: 19
Calcium, Ion: 1.17
Creatinine, Ser: 1
Hemoglobin: 13.6
Sodium: 137
TCO2: 25

## 2011-05-13 LAB — POCT RAPID STREP A: Streptococcus, Group A Screen (Direct): NEGATIVE

## 2011-05-13 LAB — SEDIMENTATION RATE: Sed Rate: 2

## 2011-05-13 LAB — CULTURE, BLOOD (ROUTINE X 2)

## 2011-12-13 IMAGING — RF DG UGI W/ HIGH DENSITY W/KUB
14 of 17 series · 14 of 17 positions shown · non-contrast
Comparison: 04/27/2005.

CLINICAL DATA: Status post lap band procedure 1441.  Now with
reflux and dilated esophagus on an outside CT.

UPPER GI SERIES WITH KUB
TECHNIQUE: Routine upper GI series was performed.
Fluoroscopy Time: 3.6. minutes

[Series 1: run · 1 of 1 slices shown (1 of 14)]
[im 1/1]
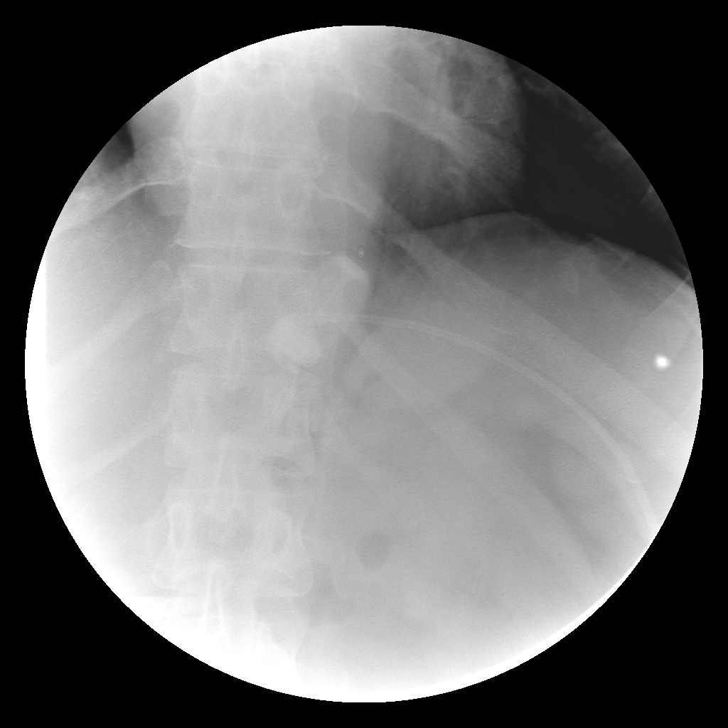

[Series 2: run · 1 of 1 slices shown (2 of 14)]
[im 1/1]
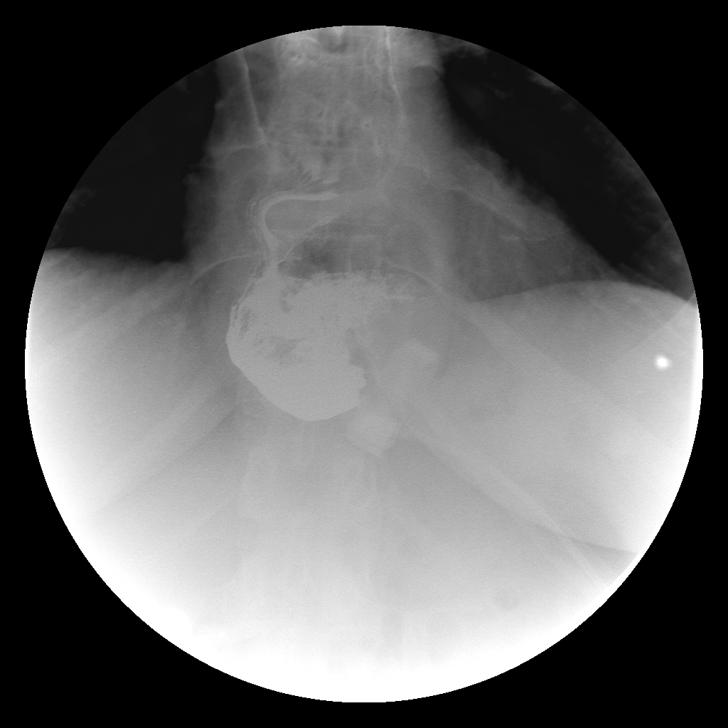

[Series 4: run · 1 of 1 slices shown (3 of 14)]
[im 1/1]
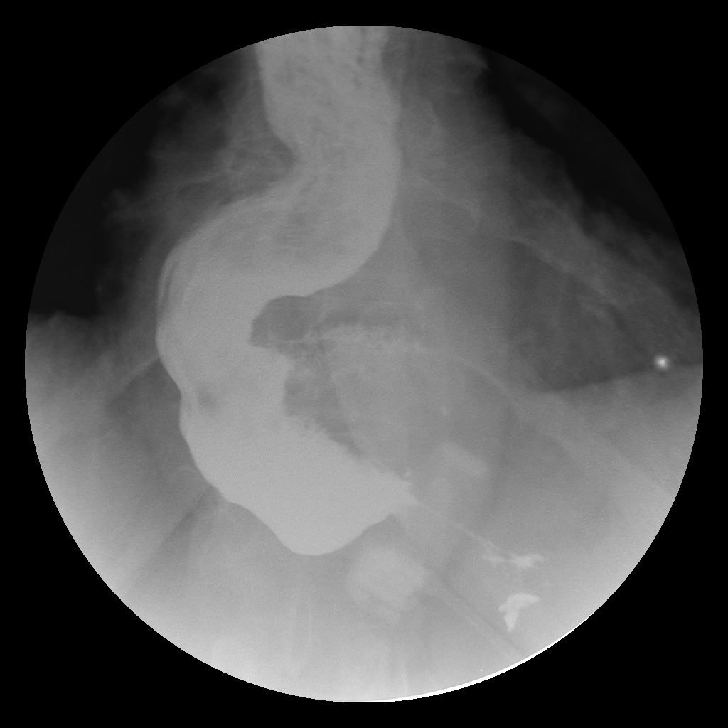

[Series 5: run · 1 of 1 slices shown (4 of 14)]
[im 1/1]
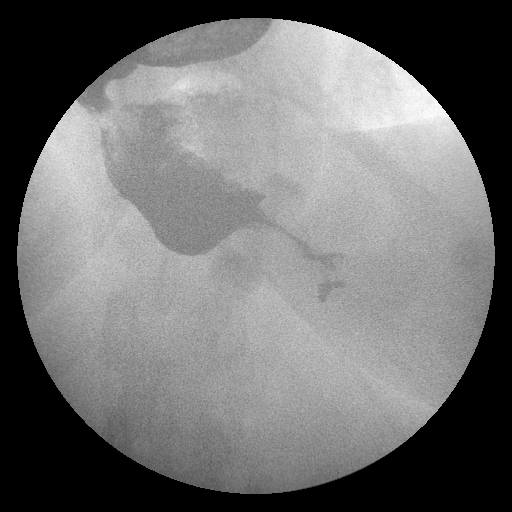

[Series 6: run · 1 of 1 slices shown (5 of 14)]
[im 1/1]
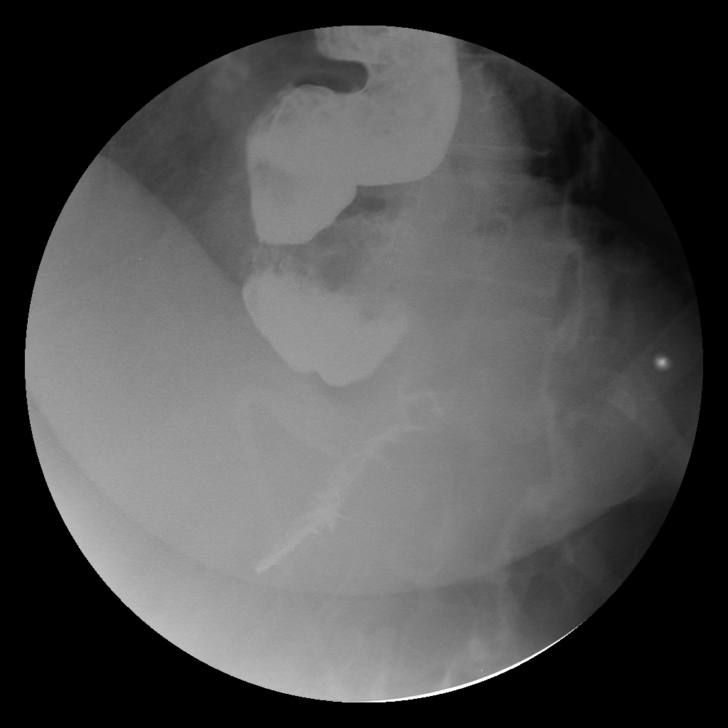

[Series 7: run · 1 of 1 slices shown (6 of 14)]
[im 1/1]
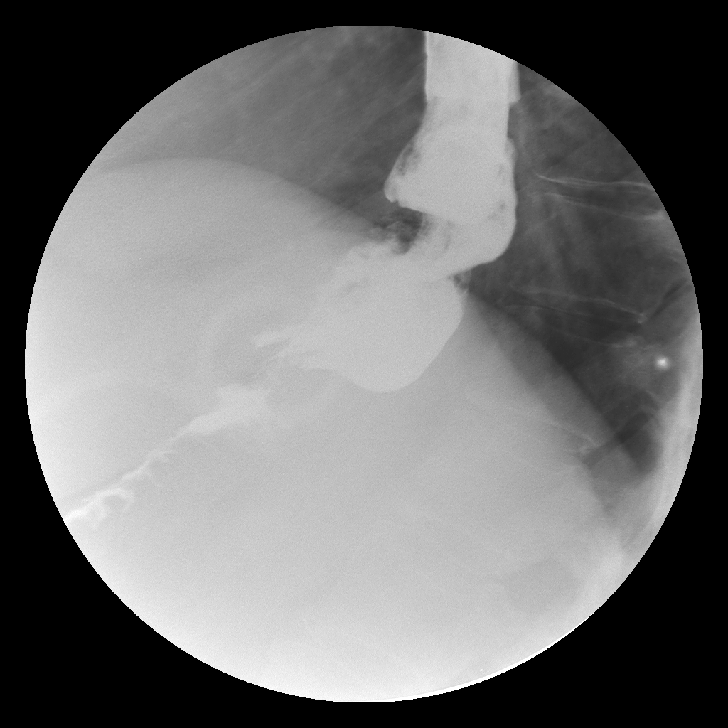

[Series 8: run · 1 of 1 slices shown (7 of 14)]
[im 1/1]
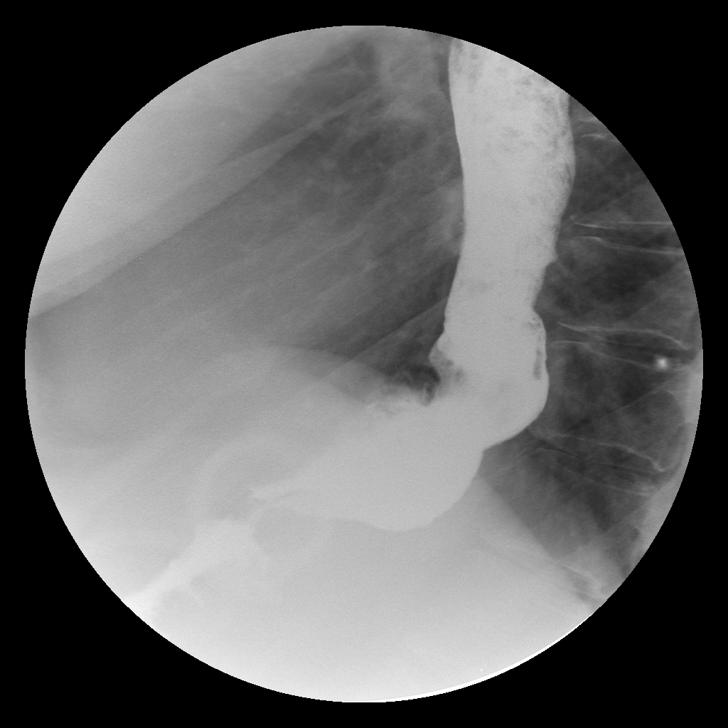

[Series 10: run · 1 of 1 slices shown (8 of 14)]
[im 1/1]
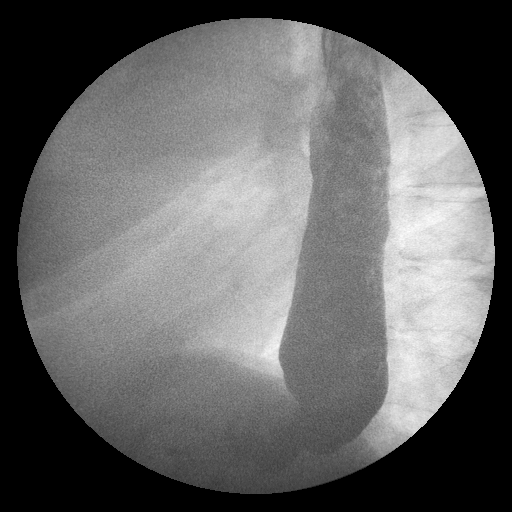

[Series 11: run · 1 of 1 slices shown (9 of 14)]
[im 1/1]
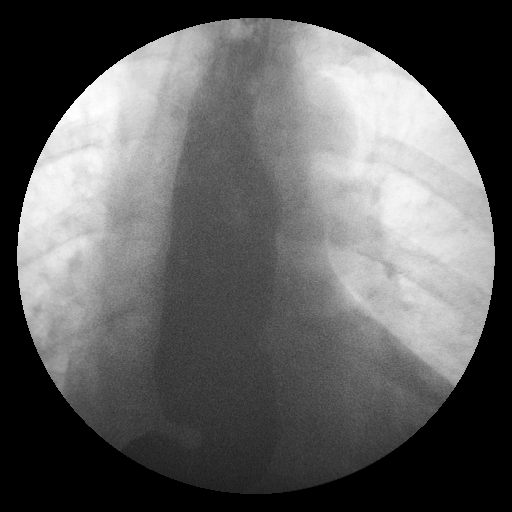

[Series 12: run · 1 of 1 slices shown (10 of 14)]
[im 1/1]
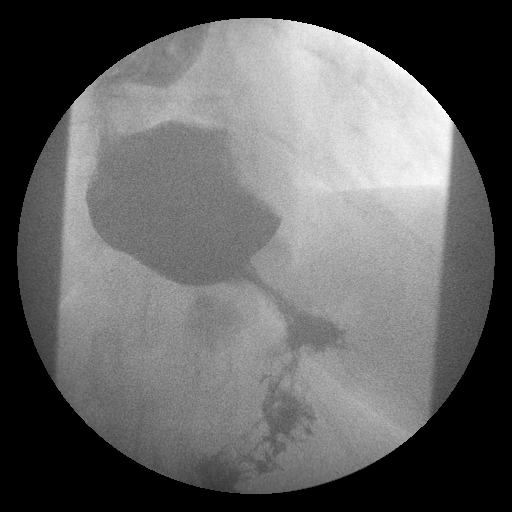

[Series 13: run · 1 of 1 slices shown (11 of 14)]
[im 1/1]
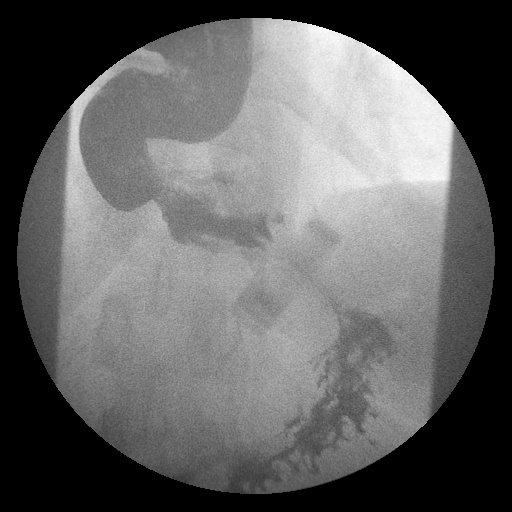

[Series 14: run · 1 of 1 slices shown (12 of 14)]
[im 1/1]
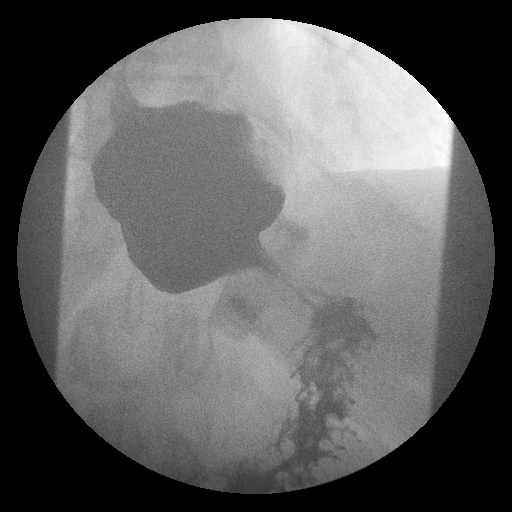

[Series 16: run · 1 of 1 slices shown (13 of 14)]
[im 1/1]
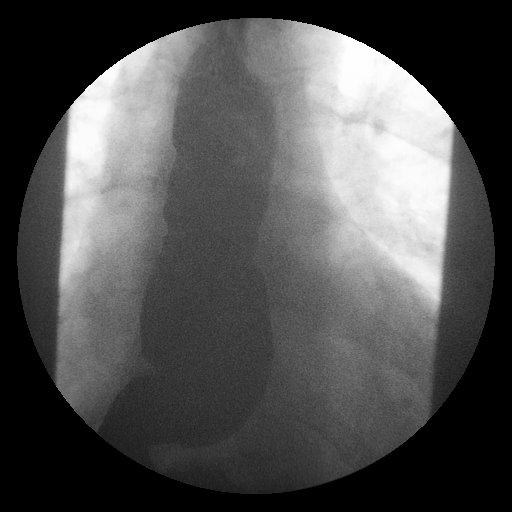

[Series 17: run · 1 of 1 slices shown (14 of 14)]
[im 1/1]
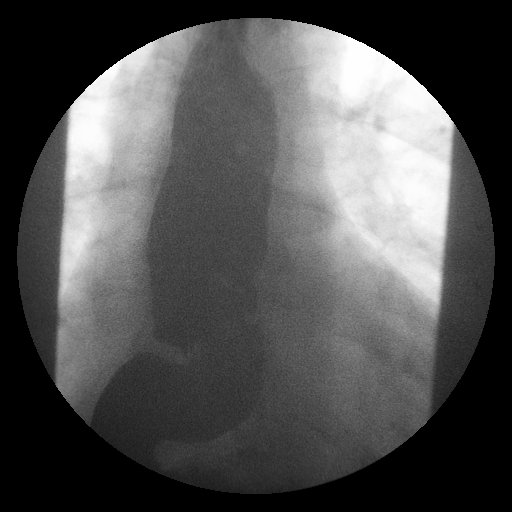

[14 of 17 positions shown; findings below may reference images not displayed]

FINDINGS: The lap band  is directed between 1 and 7 position
whereas on the prior examination, the lap band was directed between
the [DATE] and [DATE] position.

Moderately dilated esophagus with significant retained secretions
and marked holdup of flow of contrast at the lap band level.  The
study was terminated at this point.

Despite change of  patient position, incomplete filling of the left
lateral aspect of the distal esophagus.  It is possible this is
related to impression by external structure although mucosal
abnormality cannot be excluded.  Proximal to mid esophagus not
adequately evaluated.   Minimal amount contrast enters into the
stomach despite placing the patient in an d upright position and
ingestion of water.  The patient was instructed to drink water over
the next day.
IMPRESSION: Slight change of position of the lap band.

Significant holdup of flow at the lap band level with dilated
esophagus proximal to this level and poor esophageal motility.
Please see above discussion.

This has been made a call report. ([place])

## 2012-01-27 DIAGNOSIS — Z9884 Bariatric surgery status: Secondary | ICD-10-CM | POA: Insufficient documentation

## 2017-03-04 DIAGNOSIS — E119 Type 2 diabetes mellitus without complications: Secondary | ICD-10-CM | POA: Insufficient documentation

## 2017-05-06 ENCOUNTER — Encounter: Payer: Self-pay | Admitting: Internal Medicine

## 2017-12-16 DIAGNOSIS — E8881 Metabolic syndrome: Secondary | ICD-10-CM | POA: Insufficient documentation

## 2018-01-18 DIAGNOSIS — C7B8 Other secondary neuroendocrine tumors: Secondary | ICD-10-CM | POA: Insufficient documentation

## 2018-06-21 ENCOUNTER — Other Ambulatory Visit: Payer: Self-pay | Admitting: Psychiatry

## 2018-08-22 ENCOUNTER — Encounter: Payer: Self-pay | Admitting: Emergency Medicine

## 2018-09-25 ENCOUNTER — Ambulatory Visit (INDEPENDENT_AMBULATORY_CARE_PROVIDER_SITE_OTHER): Payer: BC Managed Care – PPO | Admitting: Psychiatry

## 2018-09-25 ENCOUNTER — Encounter: Payer: Self-pay | Admitting: Psychiatry

## 2018-09-25 DIAGNOSIS — F5105 Insomnia due to other mental disorder: Secondary | ICD-10-CM

## 2018-09-25 DIAGNOSIS — F331 Major depressive disorder, recurrent, moderate: Secondary | ICD-10-CM

## 2018-09-25 MED ORDER — TRAZODONE HCL 50 MG PO TABS: 50.0000 mg | ORAL_TABLET | Freq: Every day | ORAL | 0 refills | Status: DC

## 2018-09-25 MED ORDER — DULOXETINE HCL 30 MG PO CPEP: 90.0000 mg | ORAL_CAPSULE | Freq: Every day | ORAL | 2 refills | Status: DC

## 2018-09-25 NOTE — Patient Instructions (Signed)
Add duloxetine 30 mg daily and reduce Viibryd to 40 mg daily for 5 days, Then increase duloxetine to 2 of the 30 mg capsules and reduce Viibryd to 1/2 tablet daily for 5 days, Then stop Viibryd and increase duloxetine to 3 daily

## 2018-09-25 NOTE — Progress Notes (Signed)
Gloria [name] [id number] 03/30/1956 63 y.o.  Subjective:   Patient ID:  Gloria [name] is a 63 y.o. (DOB 1956/04/20) female.  Chief Complaint:  Chief Complaint  Patient presents with  . Follow-up    Mediation Management  . Stress    HPI Gloria [name] presents to the office today for follow-up of depression.  Terrible.  Situation with D worse.  Still not drinking.   Not helping with GS [name] 63 yo with a lot of anger outbursts.  The school is helpful with counseling for him.  Will probably be held back.  Young for school KG.  Disc concerns about child psychologist.  More depressed and cries a lot.  Thinks that chemo may be causing the depression.  More depressed the last 8-10 weeks.  Mostly correlated with [name]'s problems and D no help with it.  Gaining weight and not sleeping well.  EMA and not enough sleep.  Planning another sleep study.  Past psych med trials include venlafaxine, bupropion, Lexapro, citalopram, fluoxetine, Contrave, Viibryd, modafinil.  She is never been on Abilify nor Cymbalta  Review of Systems:  Review of Systems  Musculoskeletal: Positive for arthralgias and myalgias.  Neurological: Negative for tremors and weakness.  Psychiatric/Behavioral: Positive for dysphoric mood and sleep disturbance. Negative for agitation, behavioral problems, confusion, decreased concentration, hallucinations, self-injury and suicidal ideas. The patient is not nervous/anxious and is not hyperactive.     Medications: I have reviewed the patient's current medications.  Current Outpatient Medications  Medication Sig Dispense Refill  . Acetylcysteine (NAC) 600 MG CAPS Take by mouth.    . Armodafinil 200 MG TABS TAKE 1 TABLET BY MOUTH EVERY DAY IN THE MORNING 90 tablet 1  . atorvastatin (LIPITOR) 10 MG tablet Take 10 mg by mouth daily.      . Cholecalciferol 1.25 MG (50000 UT) TABS Take by mouth.    . diclofenac sodium (VOLTAREN) 1 % GEL APP 2 GRAMS EXT AA QID    .  fenofibrate 160 MG tablet Take 160 mg by mouth daily.      . iron polysaccharides (NIFEREX) 150 MG capsule Take by mouth.    . lanreotide acetate (SOMATULINE DEPOT) 120 MG/0.5ML injection Inject into the skin.    Marland Kitchen levothyroxine (SYNTHROID, LEVOTHROID) 75 MCG tablet Take 75 mcg by mouth daily.      . metFORMIN (GLUCOPHAGE) 500 MG tablet Take 500 mg by mouth daily with breakfast.      . Multiple Vitamin (MULTIVITAMIN) tablet Take 1 tablet by mouth daily.      . RABEprazole (ACIPHEX) 20 MG tablet     . triamcinolone cream (KENALOG) 0.5 % Apply to rash BID.    Marland Kitchen ursodiol (ACTIGALL) 300 MG capsule TAKE 1 CAPSULE BY MOUTH TWICE DAILY    . Vilazodone HCl (VIIBRYD) 40 MG TABS Take 60 mg by mouth every morning.     . benzonatate (TESSALON) 100 MG capsule Take 100 mg by mouth 3 (three) times daily as needed.      . DULoxetine (CYMBALTA) 30 MG capsule Take 3 capsules (90 mg total) by mouth daily. 90 capsule 2  . NEXIUM 40 MG capsule TAKE 1 CAPSULE BY MOUTH ONCE A DAY (Patient not taking: Reported on 09/25/2018) 90 capsule PRN  . traZODone (DESYREL) 50 MG tablet Take 1-2 tablets (50-100 mg total) by mouth at bedtime. 60 tablet 0   No current facility-administered medications for this visit.     Medication Side Effects: None  Allergies:  Allergies  Allergen Reactions  . Hydrophilic Rash     RASH Other reaction(s): RASH   . Lorcaserin Other (See Comments)    serotonin syndrome serotonin syndrome   . Sulfamethoxazole     REACTION: unspecified    Past Medical History:  Diagnosis Date  . Depression   . Fatty liver   . Gallstone   . GERD (gastroesophageal reflux disease)   . Hyperglycemia   . Hyperlipidemia    Dysmetabolic   . Liver abscess    Entamoeba histolytica dx'd HiPt 2011   . Liver cyst   . Malaria     from [place]  . Obstructive sleep apnea    mild NPSG- 07/30/09- AHI 4.8, RDI 15.4,  . Osteoarthritis   . Postmenopausal HRT (hormone replacement therapy)   . Urinary  incontinence     Family History  Problem Relation Age of Onset  . Diabetes Mother   . Sleep apnea Mother   . Heart failure Mother        deceased  . Lung cancer Father        deceased  . Hypertension Unknown        1st degree relative  . Colon cancer Unknown   . Arthritis Unknown   . Coronary artery disease Unknown     Social History   Socioeconomic History  . Marital status: Divorced    Spouse name: Not on file  . Number of children: Not on file  . Years of education: Not on file  . Highest education level: Not on file  Occupational History  . Occupation: Programmer, multimedia: [employer]    Comment: [place] [employer]  Social Needs  . Financial resource strain: Not on file  . Food insecurity:    Worry: Not on file    Inability: Not on file  . Transportation needs:    Medical: Not on file    Non-medical: Not on file  Tobacco Use  . Smoking status: Never Smoker  . Smokeless tobacco: Never Used  Substance and Sexual Activity  . Alcohol use: No  . Drug use: No  . Sexual activity: Not on file  Lifestyle  . Physical activity:    Days per week: Not on file    Minutes per session: Not on file  . Stress: Not on file  Relationships  . Social connections:    Talks on phone: Not on file    Gets together: Not on file    Attends religious service: Not on file    Active member of club or organization: Not on file    Attends meetings of clubs or organizations: Not on file    Relationship status: Not on file  . Intimate partner violence:    Fear of current or ex partner: Not on file    Emotionally abused: Not on file    Physically abused: Not on file    Forced sexual activity: Not on file  Other Topics Concern  . Not on file  Social History Narrative  . Not on file    Past Medical History, Surgical history, Social history, and Family history were reviewed and updated as appropriate.   Please see review of systems for further details on the patient's review  from today.   Objective:   Physical Exam:  There were no vitals taken for this visit.  Physical Exam Constitutional:      General: She is not in acute distress.    Appearance: She is well-developed.  Musculoskeletal:        General: No deformity.  Neurological:     Mental Status: She is alert and oriented to person, place, and time.     Motor: No tremor.     Coordination: Coordination normal.     Gait: Gait normal.  Psychiatric:        Attention and Perception: Attention normal. She is attentive.        Mood and Affect: Mood is anxious and depressed. Affect is tearful. Affect is not labile, blunt, angry or inappropriate.        Speech: Speech normal.        Behavior: Behavior normal.        Thought Content: Thought content normal. Thought content does not include homicidal or suicidal ideation. Thought content does not include homicidal or suicidal plan.        Cognition and Memory: Cognition normal.        Judgment: Judgment normal.     Comments: Insight is good.     Lab Review:     Component Value Date/Time   NA 136 01/14/2010 1100   K 4.5 01/14/2010 1100   CL 101 01/14/2010 1100   CO2 26 01/14/2010 1100   GLUCOSE 88 01/14/2010 1100   BUN 11 01/14/2010 1100   CREATININE 0.85 01/14/2010 1100   CALCIUM 9.6 01/14/2010 1100   PROT 6.8 01/14/2010 1100   ALBUMIN 4.4 01/14/2010 1100   AST 30 01/14/2010 1100   ALT 25 01/14/2010 1100   ALKPHOS 48 01/14/2010 1100   BILITOT 0.4 01/14/2010 1100   GFRNONAA >60 01/14/2010 1100   GFRAA  01/14/2010 1100    >60        The eGFR has been calculated using the MDRD equation. This calculation has not been validated in all clinical situations. eGFR's persistently <60 mL/min signify possible Chronic Kidney Disease.       Component Value Date/Time   WBC 9.7 01/20/2010 0435   RBC 3.83 (L) 01/20/2010 0435   HGB 11.5 (L) 01/20/2010 0435   HCT 33.7 (L) 01/20/2010 0435   PLT 226 01/20/2010 0435   MCV 88.0 01/20/2010 0435    MCHC 34.0 01/20/2010 0435   RDW 14.0 01/20/2010 0435   LYMPHSABS 0.9 01/20/2010 0435   MONOABS 0.4 01/20/2010 0435   EOSABS 0.0 01/20/2010 0435   BASOSABS 0.0 01/20/2010 0435    No results found for: POCLITH, LITHIUM   No results found for: PHENYTOIN, PHENOBARB, VALPROATE, CBMZ   .res Assessment: Plan:    Major depressive disorder, recurrent episode, moderate (HCC)  Insomnia due to mental condition   Greater than 50% of face to face time with patient was spent on counseling and coordination of care. We discussed Emery has a long history of depression which is often reactive.  She has been under my psychiatric care since 1998.  She has had a relapse recently.  She is under more stress due to her grandson acting out and her daughter not helping.  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  It also contributes to pain.  She has diffuse pain.  It would appear it is time to change the antidepressant and obviously Cymbalta should be considered because it is potential benefit for her pain. Consider Cymbalta.  Vs Abilify discussed because of its rapid onset.  She is reluctant to take  Abilify because her daughter gained a lot of weight with it..  She prefer Cymbalta bc possible benefit for chronic pain. Add duloxetine 30 mg daily and reduce Viibryd to 40 mg daily for 5 days, Then increase duloxetine to 2 of the 30 mg capsules and reduce Viibryd to 1/2 tablet daily for 5 days, Then stop Viibryd and increase duloxetine to 3 daily. Disc SSRI withdrawal.  Trazodone for sleep.  Discussed sleep apnea.  Discussed side effects of each medication  Supportive therapy on stress through the roof and more depressed.  Discussed boundary issues with daughter  This was a 30-minute appointment  Follow-up 6 weeks  Lynder Parents, MD, DFAPA Please see  After Visit Summary for patient specific instructions.  No future appointments.  No orders of the defined types were placed in this encounter.     -------------------------------

## 2018-10-17 ENCOUNTER — Other Ambulatory Visit: Payer: Self-pay | Admitting: Psychiatry

## 2018-11-08 ENCOUNTER — Other Ambulatory Visit: Payer: Self-pay

## 2018-11-08 ENCOUNTER — Ambulatory Visit (INDEPENDENT_AMBULATORY_CARE_PROVIDER_SITE_OTHER): Payer: BC Managed Care – PPO | Admitting: Psychiatry

## 2018-11-08 ENCOUNTER — Encounter: Payer: Self-pay | Admitting: Psychiatry

## 2018-11-08 DIAGNOSIS — F331 Major depressive disorder, recurrent, moderate: Secondary | ICD-10-CM

## 2018-11-08 DIAGNOSIS — G4733 Obstructive sleep apnea (adult) (pediatric): Secondary | ICD-10-CM

## 2018-11-08 DIAGNOSIS — F5105 Insomnia due to other mental disorder: Secondary | ICD-10-CM | POA: Diagnosis not present

## 2018-11-08 MED ORDER — ARMODAFINIL 200 MG PO TABS: 200.0000 mg | ORAL_TABLET | ORAL | 1 refills | Status: DC

## 2018-11-08 NOTE — Progress Notes (Signed)
Gloria [name] [id number] 1956/02/14 63 y.o.  Subjective:   Patient ID:  Gloria [name] is a 63 y.o. (DOB 05/04/1956) female.  Chief Complaint:  Chief Complaint  Patient presents with  . Depression  . Follow-up    Antidepressant change  . Sleeping Problem    HPI Gloria [name] presents  today for follow-up of depression.  Last seen September 25, 2018.  Her depression was worse.  We switched her from [place] to Cymbalta.  Trazodone was given for sleep.  New sleep study and got new machine.  Needs increase in pressure to 16. 7-8 hours sleep.    Benefit with Cymbalta switch.  No More aches and pains except knee.  Much less pain.  Amazing.  SE sweating acceptable.  Took away the pain from the shots. Depression is fine and managed.  No crying anymore.  Anxiety is manageable.  Terrible.  Situation with D worse.  Still not drinking.   Not helping with GS [name] 63 yo with a lot of anger outbursts.  He's seeing a psychologist.  Suspended twice for hitting kids.  Testing for ADD recommended.  The school is helpful with counseling for him.  Will probably be held back.  Young for school KG.  Disc concerns about child psychologist.  Past psych med trials include venlafaxine, bupropion, Lexapro, citalopram, fluoxetine, Contrave, Viibryd 60 , modafinil.  She is never been on Abilify nor Cymbalta  Review of Systems:  Review of Systems  Musculoskeletal: Negative for arthralgias and myalgias.  Neurological: Negative for tremors and weakness.  Psychiatric/Behavioral: Negative for agitation, behavioral problems, confusion, decreased concentration, dysphoric mood, hallucinations, self-injury, sleep disturbance and suicidal ideas. The patient is not nervous/anxious and is not hyperactive.     Medications: I have reviewed the patient's current medications.  Current Outpatient Medications  Medication Sig Dispense Refill  . Acetylcysteine (NAC) 600 MG CAPS Take by mouth.    . Armodafinil 200 MG  TABS Take 200 mg by mouth every morning. 90 tablet 1  . atorvastatin (LIPITOR) 10 MG tablet Take 10 mg by mouth daily.      . Cholecalciferol 1.25 MG (50000 UT) TABS Take 50,000 Units by mouth. 3 times weekly    . Dapagliflozin Propanediol (FARXIGA PO) Take 10 mg by mouth daily.    . DULoxetine (CYMBALTA) 30 MG capsule TAKE 3 CAPSULES BY MOUTH EVERY DAY 270 capsule 0  . fenofibrate 160 MG tablet Take 160 mg by mouth daily.      Marland Kitchen lanreotide acetate (SOMATULINE DEPOT) 120 MG/0.5ML injection Inject into the skin.    Marland Kitchen levothyroxine (SYNTHROID, LEVOTHROID) 75 MCG tablet Take 75 mcg by mouth daily.      . metFORMIN (GLUCOPHAGE) 500 MG tablet Take 1,000 mg by mouth 2 (two) times daily with a meal.     . RABEprazole (ACIPHEX) 20 MG tablet     . traZODone (DESYREL) 50 MG tablet TAKE 1 TO 2 TABLETS BY MOUTH AT BEDTIME 180 tablet 0  . ursodiol (ACTIGALL) 300 MG capsule TAKE 1 CAPSULE BY MOUTH TWICE DAILY    . benzonatate (TESSALON) 100 MG capsule Take 100 mg by mouth 3 (three) times daily as needed.      . diclofenac sodium (VOLTAREN) 1 % GEL APP 2 GRAMS EXT AA QID    . iron polysaccharides (NIFEREX) 150 MG capsule Take by mouth.    . Multiple Vitamin (MULTIVITAMIN) tablet Take 1 tablet by mouth daily.      Marland Kitchen NEXIUM 40 MG capsule  TAKE 1 CAPSULE BY MOUTH ONCE A DAY (Patient not taking: Reported on 09/25/2018) 90 capsule PRN  . triamcinolone cream (KENALOG) 0.5 % Apply to rash BID.     No current facility-administered medications for this visit.     Medication Side Effects: Sweating  Allergies:  Allergies  Allergen Reactions  . Hydrophilic Rash     RASH Other reaction(s): RASH   . Lorcaserin Other (See Comments)    serotonin syndrome serotonin syndrome   . Sulfamethoxazole     REACTION: unspecified    Past Medical History:  Diagnosis Date  . Depression   . Fatty liver   . Gallstone   . GERD (gastroesophageal reflux disease)   . Hyperglycemia   . Hyperlipidemia    Dysmetabolic   .  Liver abscess    Entamoeba histolytica dx'd HiPt 2011   . Liver cyst   . Malaria     from [place]  . Obstructive sleep apnea    mild NPSG- 07/30/09- AHI 4.8, RDI 15.4,  . Osteoarthritis   . Postmenopausal HRT (hormone replacement therapy)   . Urinary incontinence     Family History  Problem Relation Age of Onset  . Diabetes Mother   . Sleep apnea Mother   . Heart failure Mother        deceased  . Lung cancer Father        deceased  . Hypertension Unknown        1st degree relative  . Colon cancer Unknown   . Arthritis Unknown   . Coronary artery disease Unknown     Social History   Socioeconomic History  . Marital status: Divorced    Spouse name: Not on file  . Number of children: Not on file  . Years of education: Not on file  . Highest education level: Not on file  Occupational History  . Occupation: Programmer, multimedia: [employer]    Comment: [place] [employer]  Social Needs  . Financial resource strain: Not on file  . Food insecurity:    Worry: Not on file    Inability: Not on file  . Transportation needs:    Medical: Not on file    Non-medical: Not on file  Tobacco Use  . Smoking status: Never Smoker  . Smokeless tobacco: Never Used  Substance and Sexual Activity  . Alcohol use: No  . Drug use: No  . Sexual activity: Not on file  Lifestyle  . Physical activity:    Days per week: Not on file    Minutes per session: Not on file  . Stress: Not on file  Relationships  . Social connections:    Talks on phone: Not on file    Gets together: Not on file    Attends religious service: Not on file    Active member of club or organization: Not on file    Attends meetings of clubs or organizations: Not on file    Relationship status: Not on file  . Intimate partner violence:    Fear of current or ex partner: Not on file    Emotionally abused: Not on file    Physically abused: Not on file    Forced sexual activity: Not on file  Other Topics Concern   . Not on file  Social History Narrative  . Not on file    Past Medical History, Surgical history, Social history, and Family history were reviewed and updated as appropriate.   Please  see review of systems for further details on the patient's review from today.   Objective:   Physical Exam:  There were no vitals taken for this visit.  Physical Exam Constitutional:      General: She is not in acute distress.    Appearance: She is well-developed.  Neurological:     Mental Status: She is alert and oriented to person, place, and time.     Cranial Nerves: No dysarthria.  Psychiatric:        Attention and Perception: Attention normal. She is attentive.        Mood and Affect: Mood is not anxious or depressed. Affect is not labile, blunt, angry, tearful or inappropriate.        Speech: Speech normal.        Behavior: Behavior is not agitated, aggressive or hyperactive. Behavior is cooperative.        Thought Content: Thought content is not paranoid. Thought content does not include homicidal or suicidal ideation.        Cognition and Memory: Cognition normal.        Judgment: Judgment normal.     Comments: Insight is good.     Lab Review:     Component Value Date/Time   NA 136 01/14/2010 1100   K 4.5 01/14/2010 1100   CL 101 01/14/2010 1100   CO2 26 01/14/2010 1100   GLUCOSE 88 01/14/2010 1100   BUN 11 01/14/2010 1100   CREATININE 0.85 01/14/2010 1100   CALCIUM 9.6 01/14/2010 1100   PROT 6.8 01/14/2010 1100   ALBUMIN 4.4 01/14/2010 1100   AST 30 01/14/2010 1100   ALT 25 01/14/2010 1100   ALKPHOS 48 01/14/2010 1100   BILITOT 0.4 01/14/2010 1100   GFRNONAA >60 01/14/2010 1100   GFRAA  01/14/2010 1100    >60        The eGFR has been calculated using the MDRD equation. This calculation has not been validated in all clinical situations. eGFR's persistently <60 mL/min signify possible Chronic Kidney Disease.       Component Value Date/Time   WBC 9.7 01/20/2010  0435   RBC 3.83 (L) 01/20/2010 0435   HGB 11.5 (L) 01/20/2010 0435   HCT 33.7 (L) 01/20/2010 0435   PLT 226 01/20/2010 0435   MCV 88.0 01/20/2010 0435   MCHC 34.0 01/20/2010 0435   RDW 14.0 01/20/2010 0435   LYMPHSABS 0.9 01/20/2010 0435   MONOABS 0.4 01/20/2010 0435   EOSABS 0.0 01/20/2010 0435   BASOSABS 0.0 01/20/2010 0435    No results found for: POCLITH, LITHIUM   No results found for: PHENYTOIN, PHENOBARB, VALPROATE, CBMZ   .res Assessment: Plan:    Major depressive disorder, recurrent episode, moderate (HCC)  Insomnia due to mental condition  Obstructive sleep apnea   Greater than 50% of face to face time with patient was spent on counseling and coordination of care. We discussed Kassadi has a long history of depression which is often reactive.  She has been under my psychiatric care since 1998.  She has had a relapse recently.  She is under more stress due to her grandson acting out and her daughter not helping.  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 She is much improved with switch to duloxetine from [place] with resolution of depression and reduction in pain.  Discussed the  option of reducing duloxetine to reduce the sweating.  She prefers no change because of the marked benefit in mood and reducing her pain level  Trazodone for sleep.  Discussed sleep apnea and its treatment and her pressures in detail.  Disc chin straps to help with mouth awakening at night using nasal pillows at this time.  Brand new machine.  Discussed side effects of each medication  Supportive therapy dealing with stress of Answered questions about stimulants for grandson.  Educated about dealing with this.  This was a 35-minute appointment  I connected with patient by a video enabled telemedicine application or telephone, with their  informed consent, and verified patient privacy and that I am speaking with the correct person using two identifiers.  I was locatedwork and patient at home.   Follow-up 4 mos for 30  Lynder Parents, MD, DFAPA Please see After Visit Summary for patient specific instructions.  No future appointments.  No orders of the defined types were placed in this encounter.     -------------------------------

## 2019-01-13 ENCOUNTER — Other Ambulatory Visit: Payer: Self-pay | Admitting: Psychiatry

## 2019-04-09 ENCOUNTER — Other Ambulatory Visit: Payer: Self-pay | Admitting: Psychiatry

## 2019-06-22 ENCOUNTER — Other Ambulatory Visit: Payer: Self-pay | Admitting: Psychiatry

## 2019-06-22 NOTE — Telephone Encounter (Signed)
Was due back in July, nothing set up yet

## 2019-06-22 NOTE — Telephone Encounter (Signed)
Call to RS

## 2019-06-25 ENCOUNTER — Telehealth: Payer: Self-pay

## 2019-06-25 NOTE — Telephone Encounter (Signed)
Prior authorization submitted and approved for Armodafinil 200 mg effective 06/25/2019-06/24/2020 through [facility].

## 2019-06-27 NOTE — Telephone Encounter (Signed)
LM 06/27/19 to call for follow up appt for med mgmt

## 2019-06-28 NOTE — Telephone Encounter (Signed)
Appt made

## 2019-07-02 ENCOUNTER — Encounter: Payer: Self-pay | Admitting: Psychiatry

## 2019-07-02 ENCOUNTER — Ambulatory Visit (INDEPENDENT_AMBULATORY_CARE_PROVIDER_SITE_OTHER): Payer: BC Managed Care – PPO | Admitting: Psychiatry

## 2019-07-02 ENCOUNTER — Other Ambulatory Visit: Payer: Self-pay

## 2019-07-02 DIAGNOSIS — F5105 Insomnia due to other mental disorder: Secondary | ICD-10-CM

## 2019-07-02 DIAGNOSIS — F331 Major depressive disorder, recurrent, moderate: Secondary | ICD-10-CM

## 2019-07-02 DIAGNOSIS — G4733 Obstructive sleep apnea (adult) (pediatric): Secondary | ICD-10-CM | POA: Diagnosis not present

## 2019-07-02 NOTE — Progress Notes (Signed)
[name] [id number] 1956/05/09 63 y.o.  Virtual Visit via Telephone Note  I connected with pt by telephone and verified that I am speaking with the correct person using two identifiers.   I discussed the limitations, risks, security and privacy concerns of performing an evaluation and management service by telephone and the availability of in person appointments. I also discussed with the patient that there may be a patient responsible charge related to this service. The patient expressed understanding and agreed to proceed.  I discussed the assessment and treatment plan with the patient. The patient was provided an opportunity to ask questions and all were answered. The patient agreed with the plan and demonstrated an understanding of the instructions.   The patient was advised to call back or seek an in-person evaluation if the symptoms worsen or if the condition fails to improve as anticipated.  I provided 30  minutes of non-face-to-face time during this encounter. The call started at 10 and ended at 1030. The patient was located at home and the provider was located office.  Subjective:   Patient ID:  Gloria [name] is a 63 y.o. (DOB 31-Jul-1956) female.  Chief Complaint:  Chief Complaint  Patient presents with  . Follow-up    Medication Management  . Depression    Medication Management    Depression        Associated symptoms include no decreased concentration, no myalgias and no suicidal ideas.  Gloria [name] presents  today for follow-up of depression.  When seen February, 2020.  Her depression was worse.  We switched her from [place] to Cymbalta.  Trazodone was given for sleep.  Last seen November 08, 2018.  She gained benefit with the switch to duloxetine.  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  Doing OK overall.  FU oncologist and cancer is at [place] at this point but not gone.  Has SE sweating.  Glucose variable.  Forgetful and  referred to neurology.  STM issues  CPAP sleep study and got new machine.  Needs increase in pressure to 16. 7-8 hours sleep with the trazodone.   Still working remotely. Enjoys her job.   Benefit with Cymbalta switch.  No More aches and pains except knee.  Much less pain.  Amazing.  SE sweating acceptable.  Took away the pain from the shots. Depression is fine and managed overall.  No crying anymore.  Anxiety is manageable.  Mood is a little unstable but not severely.  D Gloria [name] Still not drinking. Convicted of DUI but limited driving privileges. Has to help Gloria [name].   Not helping with GS Gloria [name] 63 yo with a lot of anger outbursts. But better on Ritalin 10 mg.  He's seeing a psychologist.   The school is helpful with counseling for him.  Will probably be held back.  Young for school KG.  Disc concerns about child psychologist.  Past psych med trials include venlafaxine, bupropion, Lexapro, citalopram, fluoxetine, duloxetine 90 sweats, Contrave, Viibryd 60 , modafinil.   She has never been on Abilify.  Review of Systems:  Review of Systems  Musculoskeletal: Negative for arthralgias and myalgias.  Neurological: Negative for tremors and weakness.  Psychiatric/Behavioral: Positive for depression. Negative for agitation, behavioral problems, confusion, decreased concentration, dysphoric mood, hallucinations, self-injury, sleep disturbance and suicidal ideas. The patient is not nervous/anxious and is not hyperactive.     Medications: I have reviewed the patient's current medications.  Current Outpatient Medications  Medication Sig Dispense  Refill  . Armodafinil 200 MG TABS TAKE 1 TABLET (200 MG) BY MOUTH EVERY MORNING. 90 tablet 0  . atorvastatin (LIPITOR) 10 MG tablet Take 10 mg by mouth daily.      . Cholecalciferol 1.25 MG (50000 UT) TABS Take 5,000 Units by mouth.     . Coenzyme Q10 100 MG capsule Take by mouth.    . Dapagliflozin Propanediol (FARXIGA PO) Take 10 mg by mouth daily.    .  diclofenac sodium (VOLTAREN) 1 % GEL APP 2 GRAMS EXT AA QID    . DULoxetine (CYMBALTA) 30 MG capsule TAKE 3 CAPSULES BY MOUTH EVERY DAY 270 capsule 0  . fenofibrate 160 MG tablet Take 160 mg by mouth daily.      . iron polysaccharides (NIFEREX) 150 MG capsule Take by mouth.    . lanreotide acetate (SOMATULINE DEPOT) 120 MG/0.5ML injection Inject into the skin.    Marland Kitchen levothyroxine (SYNTHROID, LEVOTHROID) 75 MCG tablet Take 100 mcg by mouth daily.     . metFORMIN (GLUCOPHAGE) 500 MG tablet Take 1,000 mg by mouth 2 (two) times daily with a meal.     . Multiple Vitamin (MULTIVITAMIN) tablet Take 1 tablet by mouth daily.      . RABEprazole (ACIPHEX) 20 MG tablet     . traZODone (DESYREL) 50 MG tablet TAKE 1 TO 2 TABLETS BY MOUTH EVERY DAY AT BEDTIME 180 tablet 0  . triamcinolone cream (KENALOG) 0.5 % Apply to rash BID.    Marland Kitchen ursodiol (ACTIGALL) 300 MG capsule TAKE 1 CAPSULE BY MOUTH TWICE DAILY    . zinc gluconate 50 MG tablet Take 50 mg by mouth daily.     No current facility-administered medications for this visit.     Medication Side Effects: Sweating  Allergies:  Allergies  Allergen Reactions  . Hydrophilic Rash     RASH Other reaction(s): RASH   . Lorcaserin Other (See Comments)    serotonin syndrome serotonin syndrome   . Sulfamethoxazole     REACTION: unspecified    Past Medical History:  Diagnosis Date  . Depression   . Fatty liver   . Gallstone   . GERD (gastroesophageal reflux disease)   . Hyperglycemia   . Hyperlipidemia    Dysmetabolic   . Liver abscess    Entamoeba histolytica dx'd HiPt 2011   . Liver cyst   . Malaria     from [place]  . Obstructive sleep apnea    mild NPSG- 07/30/09- AHI 4.8, RDI 15.4,  . Osteoarthritis   . Postmenopausal HRT (hormone replacement therapy)   . Urinary incontinence     Family History  Problem Relation Age of Onset  . Diabetes Mother   . Sleep apnea Mother   . Heart failure Mother        deceased  . Lung cancer Father         deceased  . Hypertension Unknown        1st degree relative  . Colon cancer Unknown   . Arthritis Unknown   . Coronary artery disease Unknown     Social History   Socioeconomic History  . Marital status: Divorced    Spouse name: Not on file  . Number of children: Not on file  . Years of education: Not on file  . Highest education level: Not on file  Occupational History  . Occupation: Programmer, multimedia: [employer]    Comment: [employer]  Social Needs  .  Financial resource strain: Not on file  . Food insecurity    Worry: Not on file    Inability: Not on file  . Transportation needs    Medical: Not on file    Non-medical: Not on file  Tobacco Use  . Smoking status: Never Smoker  . Smokeless tobacco: Never Used  Substance and Sexual Activity  . Alcohol use: No  . Drug use: No  . Sexual activity: Not on file  Lifestyle  . Physical activity    Days per week: Not on file    Minutes per session: Not on file  . Stress: Not on file  Relationships  . Social Herbalist on phone: Not on file    Gets together: Not on file    Attends religious service: Not on file    Active member of club or organization: Not on file    Attends meetings of clubs or organizations: Not on file    Relationship status: Not on file  . Intimate partner violence    Fear of current or ex partner: Not on file    Emotionally abused: Not on file    Physically abused: Not on file    Forced sexual activity: Not on file  Other Topics Concern  . Not on file  Social History Narrative  . Not on file    Past Medical History, Surgical history, Social history, and Family history were reviewed and updated as appropriate.   Please see review of systems for further details on the patient's review from today.   Objective:   Physical Exam:  There were no vitals taken for this visit.  Physical Exam Neurological:     Mental Status: She is alert and oriented to person,  place, and time.     Cranial Nerves: No dysarthria.  Psychiatric:        Attention and Perception: Attention normal.        Mood and Affect: Mood normal.        Speech: Speech normal.        Behavior: Behavior is cooperative.        Thought Content: Thought content normal. Thought content is not paranoid or delusional. Thought content does not include homicidal or suicidal ideation. Thought content does not include homicidal or suicidal plan.        Cognition and Memory: Cognition and memory normal.        Judgment: Judgment normal.     Lab Review:     Component Value Date/Time   NA 136 01/14/2010 1100   K 4.5 01/14/2010 1100   CL 101 01/14/2010 1100   CO2 26 01/14/2010 1100   GLUCOSE 88 01/14/2010 1100   BUN 11 01/14/2010 1100   CREATININE 0.85 01/14/2010 1100   CALCIUM 9.6 01/14/2010 1100   PROT 6.8 01/14/2010 1100   ALBUMIN 4.4 01/14/2010 1100   AST 30 01/14/2010 1100   ALT 25 01/14/2010 1100   ALKPHOS 48 01/14/2010 1100   BILITOT 0.4 01/14/2010 1100   GFRNONAA >60 01/14/2010 1100   GFRAA  01/14/2010 1100    >60        The eGFR has been calculated using the MDRD equation. This calculation has not been validated in all clinical situations. eGFR's persistently <60 mL/min signify possible Chronic Kidney Disease.       Component Value Date/Time   WBC 9.7 01/20/2010 0435   RBC 3.83 (L) 01/20/2010 0435   HGB 11.5 (L) 01/20/2010 0435  HCT 33.7 (L) 01/20/2010 0435   PLT 226 01/20/2010 0435   MCV 88.0 01/20/2010 0435   MCHC 34.0 01/20/2010 0435   RDW 14.0 01/20/2010 0435   LYMPHSABS 0.9 01/20/2010 0435   MONOABS 0.4 01/20/2010 0435   EOSABS 0.0 01/20/2010 0435   BASOSABS 0.0 01/20/2010 0435    No results found for: POCLITH, LITHIUM   No results found for: PHENYTOIN, PHENOBARB, VALPROATE, CBMZ   .res Assessment: Plan:    Major depressive disorder, recurrent episode, moderate (HCC)  Insomnia due to mental condition  Obstructive sleep apnea   Greater  than 50% of face to face time with patient was spent on counseling and coordination of care. We discussed Basilia has a long history of depression which is often reactive.  She has been under my psychiatric care since 1998.  She has had a relapse until the switch from Viibryd to B12.  She is under more stress due to her grandson acting out and her daughter not helping.  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   Discussed the option of reducing duloxetine to reduce the sweating.  She prefers no change because of the marked benefit in mood and reducing her pain level Continue duloxetine 90 mg daily. Benefit Nuvigil for alertness.  Trazodone for sleep.  Discussed sleep apnea and its treatment and her pressures in detail.  Disc chin straps to help with mouth awakening at night using nasal pillows at this time.  Brand new machine.  Disc lab workup for forgetfulness including B12, folate, D, TSH .  History of low B12.   Disc pros/cons neuropsychological testing and neuro referral.  She doesn't want to do it.  Discussed side effects of each medication  Supportive therapy dealing with stress of Answered questions about stimulants for grandson.  Educated about dealing with this.  Follow-up 4 mos for 30  Lynder Parents, MD, DFAPA Please see After Visit Summary for patient specific instructions.  No future appointments.  No orders of the defined types were placed in this encounter.     -------------------------------

## 2019-08-06 ENCOUNTER — Other Ambulatory Visit: Payer: Self-pay | Admitting: Psychiatry

## 2019-09-16 ENCOUNTER — Other Ambulatory Visit: Payer: Self-pay | Admitting: Psychiatry

## 2019-09-24 ENCOUNTER — Other Ambulatory Visit: Payer: Self-pay | Admitting: Psychiatry

## 2019-10-29 ENCOUNTER — Encounter: Payer: Self-pay | Admitting: Psychiatry

## 2019-10-29 ENCOUNTER — Ambulatory Visit (INDEPENDENT_AMBULATORY_CARE_PROVIDER_SITE_OTHER): Payer: BC Managed Care – PPO | Admitting: Psychiatry

## 2019-10-29 DIAGNOSIS — F5105 Insomnia due to other mental disorder: Secondary | ICD-10-CM | POA: Diagnosis not present

## 2019-10-29 DIAGNOSIS — G4733 Obstructive sleep apnea (adult) (pediatric): Secondary | ICD-10-CM

## 2019-10-29 DIAGNOSIS — F331 Major depressive disorder, recurrent, moderate: Secondary | ICD-10-CM | POA: Diagnosis not present

## 2019-10-29 MED ORDER — ARMODAFINIL 200 MG PO TABS: ORAL_TABLET | ORAL | 1 refills | Status: DC

## 2019-10-29 MED ORDER — DULOXETINE HCL 60 MG PO CPEP: 60.0000 mg | ORAL_CAPSULE | Freq: Every day | ORAL | 1 refills | Status: DC

## 2019-10-29 NOTE — Progress Notes (Signed)
[name] [id number] 1956-06-15 64 y.o.  Virtual Visit via [place]  I connected with pt by WebEx and verified that I am speaking with the correct person using two identifiers.   I discussed the limitations, risks, security and privacy concerns of performing an evaluation and management service by [facility] [place] and the availability of in person appointments. I also discussed with the patient that there may be a patient responsible charge related to this service. The patient expressed understanding and agreed to proceed.  I discussed the assessment and treatment plan with the patient. The patient was provided an opportunity to ask questions and all were answered. The patient agreed with the plan and demonstrated an understanding of the instructions.   The patient was advised to call back or seek an in-person evaluation if the symptoms worsen or if the condition fails to improve as anticipated.  I provided 30 minutes of video time during this encounter. The call started at 1030 and ended at 11:00. The patient was located at home and the provider was located office   Subjective:   Patient ID:  Gloria [name] is a 64 y.o. (DOB [birth year]) female.  Chief Complaint:  Chief Complaint  Patient presents with  . Follow-up    depression and anxiety  . Sleeping Problem    OSA    Depression        Associated symptoms include no decreased concentration, no myalgias and no suicidal ideas.  Gloria [name] presents  today for follow-up of depression.  When seen February, 2020.  Her depression was worse.  We switched her from [place] to Cymbalta.  Trazodone was given for sleep.  seen November 08, 2018.  She gained benefit with the switch to duloxetine.  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  No med change. Continue duloxetine 90 mg daily. Benefit Nuvigil for alertness.  Doing OK overall.  Sweats are terrible.  depression and  anxiety under control.   FU oncologist and cancer is at [place] at this point but not gone.  Glucose variable.  Still working.  Forgetful..  STM issues  CPAP sleep study and got new machine.  Needs increase in pressure to 16. 7-8 hours sleep with the trazodone. Consistent with trazodone bc it helps.    Still working remotely. Enjoys her job.   Benefit with Cymbalta switch.  No More aches and pains except knee.  Much less pain.  Amazing.  SE sweating acceptable.  Took away the pain from the shots. Depression is fine and managed overall.  No crying anymore.  Anxiety is manageable.  Mood is a little unstable but not severely.  Gloria [name] Still not drinking. Convicted of DUI but limited driving privileges. Has to help Gloria [name].  Gloria [name] and Gloria [name] living with her temporarily.  Gloria [name] 64 yo.   Not helping with GS Gloria [name] 64 yo with a lot of anger outbursts. But better on Ritalin 10 mg.  He's seeing a psychologist.   The school is helpful with counseling for him.  Will probably be held back.  Young for school KG.  Disc concerns about child psychologist.  Past psych med trials include venlafaxine, bupropion, Lexapro, citalopram, fluoxetine, duloxetine 90 sweats, Contrave, Viibryd 60 , modafinil.   She has never been on Abilify.  Review of Systems:  Review of Systems  Constitutional:       Sweats  Musculoskeletal: Negative for arthralgias and myalgias.  Neurological: Negative for tremors and  weakness.  Psychiatric/Behavioral: Positive for depression. Negative for agitation, behavioral problems, confusion, decreased concentration, dysphoric mood, hallucinations, self-injury, sleep disturbance and suicidal ideas. The patient is not nervous/anxious and is not hyperactive.     Medications: I have reviewed the patient's current medications.  Current Outpatient Medications  Medication Sig Dispense Refill  . Armodafinil 200 MG TABS TAKE 1 TABLET BY MOUTH EVERY DAY IN THE MORNING 90 tablet 0  . atorvastatin (LIPITOR)  10 MG tablet Take 10 mg by mouth daily.      . Cholecalciferol 1.25 MG (50000 UT) TABS Take 5,000 Units by mouth.     . Coenzyme Q10 100 MG capsule Take by mouth.    . Dapagliflozin Propanediol (FARXIGA PO) Take 10 mg by mouth daily.    . diclofenac sodium (VOLTAREN) 1 % GEL APP 2 GRAMS EXT AA QID    . DULoxetine (CYMBALTA) 30 MG capsule TAKE 3 CAPSULES BY MOUTH EVERY DAY 270 capsule 0  . fenofibrate 160 MG tablet Take 160 mg by mouth daily.      . iron polysaccharides (NIFEREX) 150 MG capsule Take by mouth.    . lanreotide acetate (SOMATULINE DEPOT) 120 MG/0.5ML injection Inject into the skin.    Marland Kitchen levothyroxine (SYNTHROID, LEVOTHROID) 75 MCG tablet Take 100 mcg by mouth daily.     . metFORMIN (GLUCOPHAGE) 500 MG tablet Take 1,000 mg by mouth 2 (two) times daily with a meal.     . Multiple Vitamin (MULTIVITAMIN) tablet Take 1 tablet by mouth daily.      . RABEprazole (ACIPHEX) 20 MG tablet     . traZODone (DESYREL) 50 MG tablet TAKE 1 TO 2 TABLETS BY MOUTH EVERY DAY AT BEDTIME 180 tablet 0  . triamcinolone cream (KENALOG) 0.5 % Apply to rash BID.    Marland Kitchen ursodiol (ACTIGALL) 300 MG capsule TAKE 1 CAPSULE BY MOUTH TWICE DAILY    . zinc gluconate 50 MG tablet Take 50 mg by mouth daily.     No current facility-administered medications for this visit.    Medication Side Effects: Sweating  Allergies:  Allergies  Allergen Reactions  . Hydrophilic Rash     RASH Other reaction(s): RASH   . Lorcaserin Other (See Comments)    serotonin syndrome serotonin syndrome   . Sulfamethoxazole     REACTION: unspecified    Past Medical History:  Diagnosis Date  . Depression   . Fatty liver   . Gallstone   . GERD (gastroesophageal reflux disease)   . Hyperglycemia   . Hyperlipidemia    Dysmetabolic   . Liver abscess    Entamoeba histolytica dx'Gloria HiPt 2011   . Liver cyst   . Malaria     from [place]  . Obstructive sleep apnea    mild NPSG- 07/30/09- AHI 4.8, RDI 15.4,  . Osteoarthritis    . Postmenopausal HRT (hormone replacement therapy)   . Urinary incontinence     Family History  Problem Relation Age of Onset  . Diabetes Mother   . Sleep apnea Mother   . Heart failure Mother        deceased  . Lung cancer Father        deceased  . Hypertension Unknown        1st degree relative  . Colon cancer Unknown   . Arthritis Unknown   . Coronary artery disease Unknown     Social History   Socioeconomic History  . Marital status: Divorced    Spouse name: Not on  file  . Number of children: Not on file  . Years of education: Not on file  . Highest education level: Not on file  Occupational History  . Occupation: Programmer, multimedia: [employer]    Comment: [employer]  Tobacco Use  . Smoking status: Never Smoker  . Smokeless tobacco: Never Used  Substance and Sexual Activity  . Alcohol use: No  . Drug use: No  . Sexual activity: Not on file  Other Topics Concern  . Not on file  Social History Narrative  . Not on file   Social Determinants of Health   Financial Resource Strain:   . Difficulty of Paying Living Expenses:   Food Insecurity:   . Worried About Charity fundraiser in the Last Year:   . Arboriculturist in the Last Year:   Transportation Needs:   . Film/video editor (Medical):   Marland Kitchen Lack of Transportation (Non-Medical):   Physical Activity:   . Days of Exercise per Week:   . Minutes of Exercise per Session:   Stress:   . Feeling of Stress :   Social Connections:   . Frequency of Communication with Friends and Family:   . Frequency of Social Gatherings with Friends and Family:   . Attends Religious Services:   . Active Member of Clubs or Organizations:   . Attends Archivist Meetings:   Marland Kitchen Marital Status:   Intimate Partner Violence:   . Fear of Current or Ex-Partner:   . Emotionally Abused:   Marland Kitchen Physically Abused:   . Sexually Abused:     Past Medical History, Surgical history, Social history, and Family  history were reviewed and updated as appropriate.   Please see review of systems for further details on the patient's review from today.   Objective:   Physical Exam:  There were no vitals taken for this visit.  Physical Exam Neurological:     Mental Status: She is alert and oriented to person, place, and time.     Cranial Nerves: No dysarthria.  Psychiatric:        Attention and Perception: Attention normal.        Mood and Affect: Mood normal.        Speech: Speech normal.        Behavior: Behavior is cooperative.        Thought Content: Thought content normal. Thought content is not paranoid or delusional. Thought content does not include homicidal or suicidal ideation. Thought content does not include homicidal or suicidal plan.        Cognition and Memory: Cognition and memory normal.        Judgment: Judgment normal.     Lab Review:     Component Value Date/Time   NA 136 01/14/2010 1100   K 4.5 01/14/2010 1100   CL 101 01/14/2010 1100   CO2 26 01/14/2010 1100   GLUCOSE 88 01/14/2010 1100   BUN 11 01/14/2010 1100   CREATININE 0.85 01/14/2010 1100   CALCIUM 9.6 01/14/2010 1100   PROT 6.8 01/14/2010 1100   ALBUMIN 4.4 01/14/2010 1100   AST 30 01/14/2010 1100   ALT 25 01/14/2010 1100   ALKPHOS 48 01/14/2010 1100   BILITOT 0.4 01/14/2010 1100   GFRNONAA >60 01/14/2010 1100   GFRAA  01/14/2010 1100    >60        The eGFR has been calculated using the MDRD equation. This calculation has not  been validated in all clinical situations. eGFR's persistently <60 mL/min signify possible Chronic Kidney Disease.       Component Value Date/Time   WBC 9.7 01/20/2010 0435   RBC 3.83 (L) 01/20/2010 0435   HGB 11.5 (L) 01/20/2010 0435   HCT 33.7 (L) 01/20/2010 0435   PLT 226 01/20/2010 0435   MCV 88.0 01/20/2010 0435   MCHC 34.0 01/20/2010 0435   RDW 14.0 01/20/2010 0435   LYMPHSABS 0.9 01/20/2010 0435   MONOABS 0.4 01/20/2010 0435   EOSABS 0.0 01/20/2010 0435    BASOSABS 0.0 01/20/2010 0435    No results found for: POCLITH, LITHIUM   No results found for: PHENYTOIN, PHENOBARB, VALPROATE, CBMZ   .res Assessment: Plan:    Major depressive disorder, recurrent episode, moderate (HCC)  Insomnia due to mental condition  Obstructive sleep apnea   Greater than 50% of face to face time with patient was spent on counseling and coordination of care. We discussed Janaisha has a long history of depression which is often reactive.  She has been under my psychiatric care since 1998.  She has had a relapse until the switch from Viibryd to B12.  She is under more stress due to her grandson acting out and her daughter not helping.  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   Discussed the option of reducing duloxetine to reduce the sweating.  She prefers change.  Has done well with it as far as effectiveness because of the marked benefit in mood and reducing her pain level.   Disc SE in detail and SSRI withdrawal sx. discussed the risk of relapse and to call if she has any problems. Reduce duloxetine to  60 mg daily.  DT sweats. Benefit Nuvigil for alertness. Very helpful.  Trazodone for sleep.  Discussed sleep apnea and its treatment and her pressures in detail.  Disc chin straps to help with mouth awakening at night using nasal pillows at this time.  Brand new machine.  She is using CPAP regularly.  Less concerned about forgetfulness than in the past for unclear reasons.  Discussed side effects of each medication  Supportive therapy dealing with stress of Answered questions about stimulants for grandson.  Educated about dealing with this.  Follow-up 4 mos for 30  Lynder Parents, MD, DFAPA Please see After Visit Summary for patient specific instructions.  No future appointments.  No orders of the defined  types were placed in this encounter.     -------------------------------

## 2019-12-18 ENCOUNTER — Other Ambulatory Visit: Payer: Self-pay | Admitting: Psychiatry

## 2019-12-25 ENCOUNTER — Other Ambulatory Visit: Payer: Self-pay

## 2019-12-25 DIAGNOSIS — G4733 Obstructive sleep apnea (adult) (pediatric): Secondary | ICD-10-CM

## 2019-12-25 MED ORDER — ARMODAFINIL 200 MG PO TABS: ORAL_TABLET | ORAL | 1 refills | Status: DC

## 2020-05-06 ENCOUNTER — Other Ambulatory Visit: Payer: Self-pay

## 2020-05-06 DIAGNOSIS — F331 Major depressive disorder, recurrent, moderate: Secondary | ICD-10-CM

## 2020-05-06 MED ORDER — DULOXETINE HCL 60 MG PO CPEP: 60.0000 mg | ORAL_CAPSULE | Freq: Every day | ORAL | 0 refills | Status: DC

## 2020-05-06 MED ORDER — TRAZODONE HCL 50 MG PO TABS: ORAL_TABLET | ORAL | 0 refills | Status: DC

## 2020-05-29 ENCOUNTER — Other Ambulatory Visit: Payer: Self-pay | Admitting: Psychiatry

## 2020-05-30 NOTE — Telephone Encounter (Signed)
review 

## 2020-06-19 ENCOUNTER — Other Ambulatory Visit: Payer: Self-pay | Admitting: Psychiatry

## 2020-06-19 DIAGNOSIS — G4733 Obstructive sleep apnea (adult) (pediatric): Secondary | ICD-10-CM

## 2020-06-19 NOTE — Telephone Encounter (Signed)
Overdue for apt

## 2020-07-08 ENCOUNTER — Encounter: Payer: Self-pay | Admitting: Psychiatry

## 2020-07-08 ENCOUNTER — Telehealth (INDEPENDENT_AMBULATORY_CARE_PROVIDER_SITE_OTHER): Payer: BC Managed Care – PPO | Admitting: Psychiatry

## 2020-07-08 DIAGNOSIS — F5105 Insomnia due to other mental disorder: Secondary | ICD-10-CM

## 2020-07-08 DIAGNOSIS — G4733 Obstructive sleep apnea (adult) (pediatric): Secondary | ICD-10-CM | POA: Diagnosis not present

## 2020-07-08 DIAGNOSIS — F331 Major depressive disorder, recurrent, moderate: Secondary | ICD-10-CM

## 2020-07-08 MED ORDER — ARMODAFINIL 200 MG PO TABS: 200.0000 mg | ORAL_TABLET | Freq: Every morning | ORAL | 1 refills | Status: DC

## 2020-07-08 MED ORDER — TRAZODONE HCL 50 MG PO TABS
50.0000 mg | ORAL_TABLET | Freq: Every day | ORAL | 3 refills | Status: DC
Start: 2020-07-08 — End: 2020-08-04

## 2020-07-08 MED ORDER — DULOXETINE HCL 60 MG PO CPEP: 60.0000 mg | ORAL_CAPSULE | Freq: Every day | ORAL | 1 refills | Status: DC

## 2020-07-08 NOTE — Progress Notes (Signed)
[name] [id number] 1955/09/14 64 y.o.  Virtual Visit via [place]  I connected with pt by WebEx and verified that I am speaking with the correct person using two identifiers.   I discussed the limitations, risks, security and privacy concerns of performing an evaluation and management service by Gloria [name] and the availability of in person appointments. I also discussed with the patient that there may be a patient responsible charge related to this service. The patient expressed understanding and agreed to proceed.  I discussed the assessment and treatment plan with the patient. The patient was provided an opportunity to ask questions and all were answered. The patient agreed with the plan and demonstrated an understanding of the instructions.   The patient was advised to call back or seek an in-person evaluation if the symptoms worsen or if the condition fails to improve as anticipated.  I provided 30 minutes of video time during this encounter. The call started at 400and ended at 430. The patient was located at home and the provider was located office   Subjective:   Patient ID:  Gloria [name] is a 64 y.o. (DOB 07-Oct-1955) female.  Chief Complaint:  Chief Complaint  Patient presents with  . Follow-up  . Anxiety  . Depression    Depression        Associated symptoms include no decreased concentration, no myalgias and no suicidal ideas.  Gloria [name] presents  today for follow-up of depression.  When seen February, 2020.  Her depression was worse.  We switched her from [place] to Cymbalta.  Trazodone was given for sleep.  seen November 08, 2018.  She gained benefit with the switch to duloxetine.  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  No med change. Continue duloxetine 90 mg daily. Benefit Nuvigil for alertness.  10/29/19 appt noted: Doing OK overall.  Sweats are terrible.  depression and anxiety under  control.   FU oncologist and cancer is at [place] at this point but not gone.  Glucose variable.  Still working.  Forgetful..  STM issues CPAP sleep study and got new machine.  Needs increase in pressure to 16. 7-8 hours sleep with the trazodone. Consistent with trazodone bc it helps.   Still working remotely. Enjoys her job.  Benefit with Cymbalta switch.  No More aches and pains except knee.  Much less pain.  Amazing.  SE sweating acceptable.  Took away the pain from the shots. Depression is fine and managed overall.  No crying anymore.  Anxiety is manageable. Plan: Reduce duloxetine to  60 mg daily.  DT sweats. Benefit Nuvigil for alertness.  07/08/2020 appointment with following noted: Covid free and working remote mostly. Generally not depressed.  Recognizes needs to get dressed and work remotely.  No problems with reduced dulxetine to 60 mg daily.  Sweats are a little better. Patient reports stable mood and denies depressed or irritable moods.  Not generally depressed usually.  Scripture helps.  Patient denies any recent difficulty with anxiety.  Patient denies difficulty with sleep initiation or maintenance. Denies appetite disturbance.  Patient reports that energy and motivation have been good.  Patient denies any difficulty with concentration.  Patient denies any suicidal ideation. Going to [place] in Feb.  Mood is a little unstable but not severely.  Gloria [name] Still not drinking over 2 years. Convicted of DUI but limited driving privileges. Has to help Santiago [name].  Santiago [name] and [place] living with her  temporarily still.  [name] 64 yo.   Not helping with GS [name] 64 yo with a lot of anger outbursts. But better on Ritalin 10 mg.  He's seeing a psychologist.   The school is helpful with counseling for him. [name] is very impulsive.  Will probably be held back.  Young for school KG.  Disc concerns about child psychologist.  Past psych med trials include venlafaxine, bupropion, Lexapro, citalopram, fluoxetine,  duloxetine 90 sweats, Viibryd 60 ,  modafinil.   Contrave, She has never been on Abilify.  Review of Systems:  Review of Systems  Constitutional:       Sweats  Cardiovascular: Negative for palpitations.  Musculoskeletal: Negative for arthralgias and myalgias.  Neurological: Negative for tremors and weakness.  Psychiatric/Behavioral: Positive for depression. Negative for agitation, behavioral problems, confusion, decreased concentration, dysphoric mood, hallucinations, self-injury, sleep disturbance and suicidal ideas. The patient is not nervous/anxious and is not hyperactive.     Medications: I have reviewed the patient's current medications.  Current Outpatient Medications  Medication Sig Dispense Refill  . Armodafinil 200 MG TABS TAKE 1 TABLET BY MOUTH EVERY DAY IN THE MORNING 90 tablet 0  . atorvastatin (LIPITOR) 10 MG tablet Take 10 mg by mouth daily.      . Cholecalciferol 1.25 MG (50000 UT) TABS Take 5,000 Units by mouth.     . Coenzyme Q10 100 MG capsule Take by mouth.    . Dapagliflozin Propanediol (FARXIGA PO) Take 10 mg by mouth daily.    . diclofenac sodium (VOLTAREN) 1 % GEL APP 2 GRAMS EXT AA QID    . DULoxetine (CYMBALTA) 60 MG capsule Take 1 capsule (60 mg total) by mouth daily. 90 capsule 0  . fenofibrate 160 MG tablet Take 160 mg by mouth daily.      . iron polysaccharides (NIFEREX) 150 MG capsule Take by mouth.    . lanreotide acetate (SOMATULINE DEPOT) 120 MG/0.5ML injection Inject into the skin.    Marland Kitchen levothyroxine (SYNTHROID, LEVOTHROID) 75 MCG tablet Take 100 mcg by mouth daily.     . metFORMIN (GLUCOPHAGE) 500 MG tablet Take 1,000 mg by mouth 2 (two) times daily with a meal.     . Multiple Vitamin (MULTIVITAMIN) tablet Take 1 tablet by mouth daily.      . RABEprazole (ACIPHEX) 20 MG tablet     . traZODone (DESYREL) 50 MG tablet TAKE 1 TO 2 TABLETS BY MOUTH EVERY DAY AT BEDTIME 180 tablet 0  . triamcinolone cream (KENALOG) 0.5 % Apply to rash BID.    Marland Kitchen ursodiol  (ACTIGALL) 300 MG capsule TAKE 1 CAPSULE BY MOUTH TWICE DAILY    . zinc gluconate 50 MG tablet Take 50 mg by mouth daily.     No current facility-administered medications for this visit.    Medication Side Effects: Sweating  Allergies:  Allergies  Allergen Reactions  . Hydrophilic Rash     RASH Other reaction(s): RASH   . Lorcaserin Other (See Comments)    serotonin syndrome serotonin syndrome   . Sulfamethoxazole     REACTION: unspecified    Past Medical History:  Diagnosis Date  . Depression   . Fatty liver   . Gallstone   . GERD (gastroesophageal reflux disease)   . Hyperglycemia   . Hyperlipidemia    Dysmetabolic   . Liver abscess    Entamoeba histolytica dx'Gloria HiPt 2011   . Liver cyst   . Malaria     from [place]  . Obstructive sleep apnea  mild NPSG- 07/30/09- AHI 4.8, RDI 15.4,  . Osteoarthritis   . Postmenopausal HRT (hormone replacement therapy)   . Urinary incontinence     Family History  Problem Relation Age of Onset  . Diabetes Mother   . Sleep apnea Mother   . Heart failure Mother        deceased  . Lung cancer Father        deceased  . Hypertension Unknown        1st degree relative  . Colon cancer Unknown   . Arthritis Unknown   . Coronary artery disease Unknown     Social History   Socioeconomic History  . Marital status: Divorced    Spouse name: Not on file  . Number of children: Not on file  . Years of education: Not on file  . Highest education level: Not on file  Occupational History  . Occupation: Programmer, multimedia: [employer]    Comment: [employer]  Tobacco Use  . Smoking status: Never Smoker  . Smokeless tobacco: Never Used  Substance and Sexual Activity  . Alcohol use: No  . Drug use: No  . Sexual activity: Not on file  Other Topics Concern  . Not on file  Social History Narrative  . Not on file   Social Determinants of Health   Financial Resource Strain:   . Difficulty of Paying Living  Expenses: Not on file  Food Insecurity:   . Worried About Charity fundraiser in the Last Year: Not on file  . Ran Out of Food in the Last Year: Not on file  Transportation Needs:   . Lack of Transportation (Medical): Not on file  . Lack of Transportation (Non-Medical): Not on file  Physical Activity:   . Days of Exercise per Week: Not on file  . Minutes of Exercise per Session: Not on file  Stress:   . Feeling of Stress : Not on file  Social Connections:   . Frequency of Communication with Friends and Family: Not on file  . Frequency of Social Gatherings with Friends and Family: Not on file  . Attends Religious Services: Not on file  . Active Member of Clubs or Organizations: Not on file  . Attends Archivist Meetings: Not on file  . Marital Status: Not on file  Intimate Partner Violence:   . Fear of Current or Ex-Partner: Not on file  . Emotionally Abused: Not on file  . Physically Abused: Not on file  . Sexually Abused: Not on file    Past Medical History, Surgical history, Social history, and Family history were reviewed and updated as appropriate.   Please see review of systems for further details on the patient's review from today.   Objective:   Physical Exam:  There were no vitals taken for this visit.  Physical Exam Neurological:     Mental Status: She is alert and oriented to person, place, and time.     Cranial Nerves: No dysarthria.  Psychiatric:        Attention and Perception: Attention normal.        Mood and Affect: Mood is not anxious or depressed.        Speech: Speech normal.        Behavior: Behavior is cooperative.        Thought Content: Thought content normal. Thought content is not paranoid or delusional. Thought content does not include homicidal or suicidal ideation. Thought  content does not include homicidal or suicidal plan.        Cognition and Memory: Cognition and memory normal.        Judgment: Judgment normal.     Comments:  Insight good.     Lab Review:     Component Value Date/Time   NA 136 01/14/2010 1100   K 4.5 01/14/2010 1100   CL 101 01/14/2010 1100   CO2 26 01/14/2010 1100   GLUCOSE 88 01/14/2010 1100   BUN 11 01/14/2010 1100   CREATININE 0.85 01/14/2010 1100   CALCIUM 9.6 01/14/2010 1100   PROT 6.8 01/14/2010 1100   ALBUMIN 4.4 01/14/2010 1100   AST 30 01/14/2010 1100   ALT 25 01/14/2010 1100   ALKPHOS 48 01/14/2010 1100   BILITOT 0.4 01/14/2010 1100   GFRNONAA >60 01/14/2010 1100   GFRAA  01/14/2010 1100    >60        The eGFR has been calculated using the MDRD equation. This calculation has not been validated in all clinical situations. eGFR's persistently <60 mL/min signify possible Chronic Kidney Disease.       Component Value Date/Time   WBC 9.7 01/20/2010 0435   RBC 3.83 (L) 01/20/2010 0435   HGB 11.5 (L) 01/20/2010 0435   HCT 33.7 (L) 01/20/2010 0435   PLT 226 01/20/2010 0435   MCV 88.0 01/20/2010 0435   MCHC 34.0 01/20/2010 0435   RDW 14.0 01/20/2010 0435   LYMPHSABS 0.9 01/20/2010 0435   MONOABS 0.4 01/20/2010 0435   EOSABS 0.0 01/20/2010 0435   BASOSABS 0.0 01/20/2010 0435    No results found for: POCLITH, LITHIUM   No results found for: PHENYTOIN, PHENOBARB, VALPROATE, CBMZ   .res Assessment: Plan:    Major depressive disorder, recurrent episode, moderate (HCC)  Insomnia due to mental condition  Obstructive sleep apnea   Greater than 50% of face to face time with patient was spent on counseling and coordination of care. We discussed Bostyn has a long history of depression which is often reactive.  She has been under my psychiatric care since 1998.   She is under more stress due to her grandson acting out and her daughter not helping.  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    Discussed the option of reducing duloxetine to reduce the sweating.  She prefers change.  Has done well with it as far as effectiveness because  of the marked benefit in mood and reducing her pain level.   Disc SE in detail and SSRI withdrawal sx. discussed the risk of relapse and to call if she has any problems. Reduced duloxetine to  60 mg daily  DT sweats which are some better and mood and anxiety are not worse. Benefit Nuvigil for alertness. Very helpful.  Trazodone for sleep 1 tablet working.  Discussed sleep apnea and its treatment and her pressures in detail.  Disc chin straps to help with mouth awakening at night using nasal pillows at this time.  Brand new machine.  She is using CPAP regularly.  Less concerned about forgetfulness than in the past for unclear reasons.  Discussed side effects of each medication  Supportive therapy dealing with stress of Answered questions about stimulants for grandson.  Educated about dealing with this.  Follow-up 6 mos for 30  Lynder Parents, MD, DFAPA Please see After Visit Summary for patient specific instructions.  Future Appointments  Date Time Provider [place]  12/17/2020  4:00 PM Cottle, Hiram Comber  Fredirick Maudlin., MD CP-CP None    No orders of the defined types were placed in this encounter.     -------------------------------

## 2020-08-03 ENCOUNTER — Other Ambulatory Visit: Payer: Self-pay | Admitting: Psychiatry

## 2020-08-03 DIAGNOSIS — F331 Major depressive disorder, recurrent, moderate: Secondary | ICD-10-CM

## 2020-08-03 DIAGNOSIS — F5105 Insomnia due to other mental disorder: Secondary | ICD-10-CM

## 2020-09-22 ENCOUNTER — Telehealth: Payer: Self-pay

## 2020-09-22 NOTE — Telephone Encounter (Signed)
Prior Authorization submitted and approved for ARMODAFINIL 200 MG effective 09/19/2020-09/19/2021 with [employer] ID# [id number]

## 2020-12-17 ENCOUNTER — Ambulatory Visit (INDEPENDENT_AMBULATORY_CARE_PROVIDER_SITE_OTHER): Payer: BC Managed Care – PPO | Admitting: Psychiatry

## 2020-12-17 ENCOUNTER — Encounter: Payer: Self-pay | Admitting: Psychiatry

## 2020-12-17 ENCOUNTER — Other Ambulatory Visit: Payer: Self-pay

## 2020-12-17 DIAGNOSIS — F5105 Insomnia due to other mental disorder: Secondary | ICD-10-CM | POA: Diagnosis not present

## 2020-12-17 DIAGNOSIS — G4733 Obstructive sleep apnea (adult) (pediatric): Secondary | ICD-10-CM | POA: Diagnosis not present

## 2020-12-17 DIAGNOSIS — F331 Major depressive disorder, recurrent, moderate: Secondary | ICD-10-CM

## 2020-12-17 MED ORDER — DULOXETINE HCL 60 MG PO CPEP: 60.0000 mg | ORAL_CAPSULE | Freq: Every day | ORAL | 3 refills | Status: DC

## 2020-12-17 MED ORDER — ARMODAFINIL 200 MG PO TABS: 200.0000 mg | ORAL_TABLET | Freq: Every morning | ORAL | 1 refills | Status: DC

## 2020-12-17 MED ORDER — TRAZODONE HCL 50 MG PO TABS: ORAL_TABLET | ORAL | 3 refills | Status: DC

## 2020-12-17 NOTE — Progress Notes (Signed)
Gloria [name] [id number] [birth year] 65 y.o.    Subjective:   Patient ID:  Gloria [name] is a 65 y.o. (DOB 08-11-1956) female.  Chief Complaint:  Chief Complaint  Patient presents with  . Follow-up  . Major depressive disorder, recurrent episode, moderate (HCC)    Depression        Associated symptoms include no decreased concentration, no myalgias and no suicidal ideas.  Gloria [name] presents  today for follow-up of depression.  When seen February, 2020.  Her depression was worse.  We switched her from [facility] to Cymbalta.  Trazodone was given for sleep.  seen November 08, 2018.  She gained benefit with the switch to duloxetine.  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  No med change. Continue duloxetine 90 mg daily. Benefit Nuvigil for alertness.  10/29/19 appt noted: Doing OK overall.  Sweats are terrible.  depression and anxiety under control.   FU oncologist and cancer is at [place] at this point but not gone.  Glucose variable.  Still working.  Forgetful..  STM issues CPAP sleep study and got new machine.  Needs increase in pressure to 16. 7-8 hours sleep with the trazodone. Consistent with trazodone bc it helps.   Still working remotely. Enjoys her job.  Benefit with Cymbalta switch.  No More aches and pains except knee.  Much less pain.  Amazing.  SE sweating acceptable.  Took away the pain from the shots. Depression is fine and managed overall.  No crying anymore.  Anxiety is manageable. Plan: Reduce duloxetine to  60 mg daily.  DT sweats. Benefit Nuvigil for alertness.  07/08/2020 appointment with following noted: Covid free and working remote mostly. Generally not depressed.  Recognizes needs to get dressed and work remotely.  No problems with reduced dulxetine to 60 mg daily.  Sweats are a little better. Patient reports stable mood and denies depressed or irritable moods.  Not generally depressed  usually.  Scripture helps.  Patient denies any recent difficulty with anxiety.  Patient denies difficulty with sleep initiation or maintenance. Denies appetite disturbance.  Patient reports that energy and motivation have been good.  Patient denies any difficulty with concentration.  Patient denies any suicidal ideation. Going to [place] in Feb. Mood is a little unstable but not severely.  D Santiago [name] Still not drinking over 2 years. Convicted of DUI but limited driving privileges. Has to help Santiago [name].  Santiago [name] and Gloria [name] living with her temporarily still.  [name] 65 yo.   Not helping with Gloria [name] 65 yo with a lot of anger outbursts. But better on Ritalin 10 mg.  He's seeing a psychologist.   The school is helpful with counseling for him. Gloria [name] is very impulsive.  Will probably be held back.  Young for school KG.  Disc concerns about child psychologist. Plan: no med changes  12/17/20 appt with following noted: Went to [place] in Feb.  [place] time.   Gloria [name] and Gloria [name] still live with her DT $.  Gloria 65 yo on Ritalin.Gloria in counseling. Doing fine other wise.   Patient reports stable mood and denies depressed or irritable moods.  Patient denies any recent difficulty with anxiety.  Patient denies difficulty with sleep initiation or maintenance. Denies appetite disturbance.  Patient reports that energy and motivation have been good.  Patient denies any difficulty with concentration.  Patient denies any suicidal ideation.   Past psych med trials include venlafaxine, bupropion,  Lexapro, citalopram, fluoxetine, duloxetine 90 sweats, Viibryd 60 ,  modafinil.   Contrave, She has never been on Abilify.  Review of Systems:  Review of Systems  Constitutional:       Sweats  Cardiovascular: Negative for palpitations.  Musculoskeletal: Negative for arthralgias and myalgias.  Neurological: Negative for tremors and weakness.  Psychiatric/Behavioral: Positive for depression. Negative for agitation, behavioral problems,  confusion, decreased concentration, dysphoric mood, hallucinations, self-injury, sleep disturbance and suicidal ideas. The patient is not nervous/anxious and is not hyperactive.     Medications: I have reviewed the patient's current medications.  Current Outpatient Medications  Medication Sig Dispense Refill  . atorvastatin (LIPITOR) 10 MG tablet Take 10 mg by mouth daily.    . Cholecalciferol 1.25 MG (50000 UT) TABS Take 5,000 Units by mouth.     . cycloSPORINE (RESTASIS) 0.05 % ophthalmic emulsion 1 drop 2 (two) times daily.    . Dapagliflozin Propanediol (FARXIGA PO) Take 10 mg by mouth daily.    . diclofenac sodium (VOLTAREN) 1 % GEL APP 2 GRAMS EXT AA QID    . fenofibrate 160 MG tablet Take 160 mg by mouth daily.    . iron polysaccharides (NIFEREX) 150 MG capsule Take by mouth.    . lanreotide acetate (SOMATULINE DEPOT) 120 MG/0.5ML injection Inject into the skin.    Marland Kitchen levothyroxine (SYNTHROID, LEVOTHROID) 75 MCG tablet Take 100 mcg by mouth daily.    . metFORMIN (GLUCOPHAGE) 500 MG tablet Take 1,000 mg by mouth 2 (two) times daily with a meal.    . Multiple Vitamin (MULTIVITAMIN) tablet Take 1 tablet by mouth daily.    . RABEprazole (ACIPHEX) 20 MG tablet     . triamcinolone cream (KENALOG) 0.5 % Apply to rash BID.    Marland Kitchen ursodiol (ACTIGALL) 300 MG capsule TAKE 1 CAPSULE BY MOUTH TWICE DAILY    . zinc gluconate 50 MG tablet Take 50 mg by mouth daily.    . Armodafinil 200 MG TABS Take 200 mg by mouth in the morning. 90 tablet 1  . Coenzyme Q10 100 MG capsule Take by mouth. (Patient not taking: Reported on 12/17/2020)    . DULoxetine (CYMBALTA) 60 MG capsule Take 1 capsule (60 mg total) by mouth daily. 90 capsule 3  . traZODone (DESYREL) 50 MG tablet 1-2 tablets at night for sleep as needed 180 tablet 3   No current facility-administered medications for this visit.    Medication Side Effects: Sweating  Allergies:  Allergies  Allergen Reactions  . Hydrophilic Rash     RASH Other  reaction(s): RASH   . Lorcaserin Other (See Comments)    serotonin syndrome serotonin syndrome   . Sulfamethoxazole     REACTION: unspecified    Past Medical History:  Diagnosis Date  . Depression   . Fatty liver   . Gallstone   . GERD (gastroesophageal reflux disease)   . Hyperglycemia   . Hyperlipidemia    Dysmetabolic   . Liver abscess    Entamoeba histolytica dx'd HiPt 2011   . Liver cyst   . Malaria     from [place]  . Obstructive sleep apnea    mild NPSG- 07/30/09- AHI 4.8, RDI 15.4,  . Osteoarthritis   . Postmenopausal HRT (hormone replacement therapy)   . Urinary incontinence     Family History  Problem Relation Age of Onset  . Diabetes Mother   . Sleep apnea Mother   . Heart failure Mother        deceased  .  Lung cancer Father        deceased  . Hypertension Other        1st degree relative  . Colon cancer Other   . Arthritis Other   . Coronary artery disease Other     Social History   Socioeconomic History  . Marital status: Divorced    Spouse name: Not on file  . Number of children: Not on file  . Years of education: Not on file  . Highest education level: Not on file  Occupational History  . Occupation: Programmer, multimedia: [employer]    Comment: [employer]  Tobacco Use  . Smoking status: Never Smoker  . Smokeless tobacco: Never Used  Substance and Sexual Activity  . Alcohol use: No  . Drug use: No  . Sexual activity: Not on file  Other Topics Concern  . Not on file  Social History Narrative  . Not on file   Social Determinants of Health   Financial Resource Strain: Not on file  Food Insecurity: Not on file  Transportation Needs: Not on file  Physical Activity: Not on file  Stress: Not on file  Social Connections: Not on file  Intimate Partner Violence: Not on file    Past Medical History, Surgical history, Social history, and Family history were reviewed and updated as appropriate.   Please see review of  systems for further details on the patient's review from today.   Objective:   Physical Exam:  There were no vitals taken for this visit.  Physical Exam Neurological:     Mental Status: She is alert and oriented to person, place, and time.     Cranial Nerves: No dysarthria.  Psychiatric:        Attention and Perception: Attention normal.        Mood and Affect: Mood is not anxious or depressed.        Speech: Speech normal.        Behavior: Behavior is cooperative.        Thought Content: Thought content normal. Thought content is not paranoid or delusional. Thought content does not include homicidal or suicidal ideation. Thought content does not include homicidal or suicidal plan.        Cognition and Memory: Cognition and memory normal.        Judgment: Judgment normal.     Comments: Insight good.     Lab Review:     Component Value Date/Time   NA 136 01/14/2010 1100   K 4.5 01/14/2010 1100   CL 101 01/14/2010 1100   CO2 26 01/14/2010 1100   GLUCOSE 88 01/14/2010 1100   BUN 11 01/14/2010 1100   CREATININE 0.85 01/14/2010 1100   CALCIUM 9.6 01/14/2010 1100   PROT 6.8 01/14/2010 1100   ALBUMIN 4.4 01/14/2010 1100   AST 30 01/14/2010 1100   ALT 25 01/14/2010 1100   ALKPHOS 48 01/14/2010 1100   BILITOT 0.4 01/14/2010 1100   GFRNONAA >60 01/14/2010 1100   GFRAA  01/14/2010 1100    >60        The eGFR has been calculated using the MDRD equation. This calculation has not been validated in all clinical situations. eGFR's persistently <60 mL/min signify possible Chronic Kidney Disease.       Component Value Date/Time   WBC 9.7 01/20/2010 0435   RBC 3.83 (L) 01/20/2010 0435   HGB 11.5 (L) 01/20/2010 0435   HCT 33.7 (L) 01/20/2010 3545  PLT 226 01/20/2010 0435   MCV 88.0 01/20/2010 0435   MCHC 34.0 01/20/2010 0435   RDW 14.0 01/20/2010 0435   LYMPHSABS 0.9 01/20/2010 0435   MONOABS 0.4 01/20/2010 0435   EOSABS 0.0 01/20/2010 0435   BASOSABS 0.0 01/20/2010  0435    No results found for: POCLITH, LITHIUM   No results found for: PHENYTOIN, PHENOBARB, VALPROATE, CBMZ   .res Assessment: Plan:    Major depressive disorder, recurrent episode, moderate (HCC) - Plan: DULoxetine (CYMBALTA) 60 MG capsule  Obstructive sleep apnea - Plan: Armodafinil 200 MG TABS  Insomnia due to mental condition - Plan: traZODone (DESYREL) 50 MG tablet   Greater than 50% of face to face time with patient was spent on counseling and coordination of care. We discussed [place] has a long history of depression which is often reactive.  She has been under my psychiatric care since 1998.   She is under more stress due to her grandson acting out and her daughter not helping.  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    Discussed the option of reducing duloxetine to reduce the sweating.  She prefers change.  Has done well with it as far as effectiveness because of the marked benefit in mood and reducing her pain level.   Disc SE in detail and SSRI withdrawal sx. discussed the risk of relapse and to call if she has any problems. Reduced duloxetine to  60 mg daily  DT sweats which are some better and mood and anxiety are not worse. Benefit Nuvigil for alertness. Very helpful.  No med changes  Trazodone for sleep 1 tablet working.  Discussed sleep apnea and its treatment and her pressures in detail.  Disc chin straps to help with mouth awakening at night using nasal pillows at this time.  Brand new machine.  She is using CPAP regularly.  Less concerned about forgetfulness than in the past for unclear reasons.  Discussed side effects of each medication  Supportive therapy dealing with stress of Answered questions about stimulants for grandson.  Educated about dealing with this.  Follow-up 6 mos for 30  Lynder Parents, MD, DFAPA Please see After Visit Summary for patient specific instructions.  No future appointments.  No orders of the  defined types were placed in this encounter.     -------------------------------

## 2021-06-21 ENCOUNTER — Other Ambulatory Visit: Payer: Self-pay | Admitting: Psychiatry

## 2021-06-21 DIAGNOSIS — G4733 Obstructive sleep apnea (adult) (pediatric): Secondary | ICD-10-CM

## 2021-09-23 ENCOUNTER — Telehealth: Payer: Self-pay

## 2021-09-23 NOTE — Telephone Encounter (Signed)
Prior Authorization submitted and approved for ARMODAFINIL 250 MG effective 09/23/2021-09/23/2022 with [employer] ID# [id number]

## 2021-12-23 ENCOUNTER — Telehealth (INDEPENDENT_AMBULATORY_CARE_PROVIDER_SITE_OTHER): Payer: BC Managed Care – PPO | Admitting: Psychiatry

## 2021-12-23 ENCOUNTER — Encounter: Payer: Self-pay | Admitting: Psychiatry

## 2021-12-23 DIAGNOSIS — F5105 Insomnia due to other mental disorder: Secondary | ICD-10-CM | POA: Diagnosis not present

## 2021-12-23 DIAGNOSIS — G4733 Obstructive sleep apnea (adult) (pediatric): Secondary | ICD-10-CM | POA: Diagnosis not present

## 2021-12-23 DIAGNOSIS — F331 Major depressive disorder, recurrent, moderate: Secondary | ICD-10-CM | POA: Diagnosis not present

## 2021-12-23 MED ORDER — ARMODAFINIL 200 MG PO TABS: 200.0000 mg | ORAL_TABLET | Freq: Every morning | ORAL | 1 refills | Status: DC

## 2021-12-23 MED ORDER — TRAZODONE HCL 50 MG PO TABS: ORAL_TABLET | ORAL | 3 refills | Status: DC

## 2021-12-23 MED ORDER — DULOXETINE HCL 60 MG PO CPEP: 60.0000 mg | ORAL_CAPSULE | Freq: Every day | ORAL | 3 refills | Status: DC

## 2021-12-23 NOTE — Progress Notes (Addendum)
Gloria [name] ?[id number] ?1956-01-17 ?66 y.o. ? ?Video Visit via My Chart ? ?I connected with pt by video using My Chart and verified that I am speaking with the correct person using two identifiers. ?  ?I discussed the limitations, risks, security and privacy concerns of performing an evaluation and management service by My Chart  and the availability of in person appointments. I also discussed with the patient that there may be a patient responsible charge related to this service. The patient expressed understanding and agreed to proceed. ? ?I discussed the assessment and treatment plan with the patient. The patient was provided an opportunity to ask questions and all were answered. The patient agreed with the plan and demonstrated an understanding of the instructions. ?  ?The patient was advised to call back or seek an in-person evaluation if the symptoms worsen or if the condition fails to improve as anticipated. ? ?I provided 30 minutes of video time during this encounter.  The patient was located at home and the provider was located office. ?Session from 4 15-4 40 5 PM ? ?Subjective:  ? ?Patient ID:  Gloria [name] is a 65 y.o. (DOB May 17, 1956) female. ? ?Chief Complaint:  ?Chief Complaint  ?Patient presents with  ? Follow-up  ? Depression  ? ? ?Depression ?       Associated symptoms include no decreased concentration, no myalgias and no suicidal ideas. ?GIONNI FREESE presents  today for follow-up of depression. ? ?When seen February, 2020.  Her depression was worse.  We switched her from [place] to Cymbalta.  Trazodone was given for sleep. ? ?seen November 08, 2018.  She gained benefit with the switch to duloxetine.  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 ? seen July 02, 2019.  No med change. Continue duloxetine 90 mg daily. ?Benefit Nuvigil for alertness. ? ?10/29/19 appt noted: ?Doing OK overall.  Sweats are terrible.  depression and anxiety under control.   FU  oncologist and cancer is at [place] at this point but not gone.  Glucose variable.  Still working.  Forgetful..  STM issues ?CPAP sleep study and got new machine.  Needs increase in pressure to 16. 7-8 hours sleep with the trazodone. Consistent with trazodone bc it helps.   ?Still working remotely. Enjoys her job.  ?Benefit with Cymbalta switch.  No More aches and pains except knee.  Much less pain.  Amazing.  SE sweating acceptable.  Took away the pain from the shots. Depression is fine and managed overall.  No crying anymore.  Anxiety is manageable. ?Plan: Reduce duloxetine to  60 mg daily.  DT sweats. ?Benefit Nuvigil for alertness. ? ?07/08/2020 appointment with following noted: ?Covid free and working remote mostly. ?Generally not depressed.  Recognizes needs to get dressed and work remotely.  No problems with reduced dulxetine to 60 mg daily.  Sweats are a little better. ?Patient reports stable mood and denies depressed or irritable moods.  Not generally depressed usually.  Scripture helps.  Patient denies any recent difficulty with anxiety.  Patient denies difficulty with sleep initiation or maintenance. Denies appetite disturbance.  Patient reports that energy and motivation have been good.  Patient denies any difficulty with concentration.  Patient denies any suicidal ideation. ?Going to [place] in Feb. ?Mood is a little unstable but not severely.  D [name] Still not drinking over 2 years. Convicted of DUI but limited driving privileges. Has to help [name].  [name] and [place] living with her  temporarily still.  [name] 66 yo.   ?Not helping with GS [name] 66 yo with a lot of anger outbursts. But better on Ritalin 10 mg.  He's seeing a psychologist.   The school is helpful with counseling for him. [name] is very impulsive. ? Will probably be held back.  Young for school KG.  Disc concerns about child psychologist. ?Plan: no med changes ? ?12/17/20 appt with following noted: ?Went to [place] in Feb.  [place] time.   ?Elmer Ramp and [name] still live with her DT $.  GS 66 yo on Ritalin.GS in counseling. ?Doing fine other wise.   ?Patient reports stable mood and denies depressed or irritable moods.  Patient denies any recent difficulty with anxiety.  Patient denies difficulty with sleep initiation or maintenance. Denies appetite disturbance.  Patient reports that energy and motivation have been good.  Patient denies any difficulty with concentration.  Patient denies any suicidal ideation. ? ?12/23/21 appt noted: ?Getting monthly shot with cancer shot and doing well so far.   Holding it at [place].  Has mets. ?Went to [place] on a cruise. ?Remains on duloxetine 60 and Nuvigil 200 mg daily.  Taking trazodone 100 mg HS for sleep needed. ?No SE problems now. ?No problems with depression.   ?TKR December 2022.  Doing really well.  Doing PT.  Next one in November. ?No problems with meds.   ?Alertness is good with Nuvigil.  Satisfied and worse off it. ?Using CPAP consistently.   ?GS lives with her and 66 yo and lives with them.  Seeing child psychiatrist, Dr. Brain Hilts. ? ?Past psych med trials include venlafaxine, bupropion, Lexapro, citalopram, fluoxetine, duloxetine 90 sweats, Viibryd 60 ,  ?modafinil. Nuvigil 200,    Contrave, ?She has never been on Abilify. ? ?Review of Systems:  ?Review of Systems  ?Constitutional:   ?     Sweats  ?Cardiovascular:  Negative for palpitations.  ?Musculoskeletal:  Positive for arthralgias. Negative for myalgias.  ?Neurological:  Negative for tremors and weakness.  ?Psychiatric/Behavioral:  Positive for depression. Negative for agitation, behavioral problems, confusion, decreased concentration, dysphoric mood, hallucinations, self-injury, sleep disturbance and suicidal ideas. The patient is not nervous/anxious and is not hyperactive.   ? ?Medications: I have reviewed the patient's current medications. ? ?Current Outpatient Medications  ?Medication Sig Dispense Refill  ? atorvastatin (LIPITOR) 10 MG tablet Take 10 mg by  mouth daily.    ? Cholecalciferol 1.25 MG (50000 UT) TABS Take 5,000 Units by mouth.     ? Coenzyme Q10 100 MG capsule Take by mouth.    ? cycloSPORINE (RESTASIS) 0.05 % ophthalmic emulsion 1 drop 2 (two) times daily.    ? Dapagliflozin Propanediol (FARXIGA PO) Take 10 mg by mouth daily.    ? diclofenac sodium (VOLTAREN) 1 % GEL APP 2 GRAMS EXT AA QID    ? fenofibrate 160 MG tablet Take 160 mg by mouth daily.    ? iron polysaccharides (NIFEREX) 150 MG capsule Take by mouth.    ? lanreotide acetate (SOMATULINE DEPOT) 120 MG/0.5ML injection Inject into the skin.    ? levothyroxine (SYNTHROID, LEVOTHROID) 75 MCG tablet Take 100 mcg by mouth daily.    ? losartan (COZAAR) 25 MG tablet Take 25 mg by mouth daily.    ? metFORMIN (GLUCOPHAGE) 500 MG tablet Take 1,000 mg by mouth 2 (two) times daily with a meal.    ? Multiple Vitamin (MULTIVITAMIN) tablet Take 1 tablet by mouth daily.    ? RABEprazole (ACIPHEX)  20 MG tablet     ? triamcinolone cream (KENALOG) 0.5 % Apply to rash BID.    ? ursodiol (ACTIGALL) 300 MG capsule TAKE 1 CAPSULE BY MOUTH TWICE DAILY    ? zinc gluconate 50 MG tablet Take 50 mg by mouth daily.    ? Armodafinil 200 MG TABS Take 200 mg by mouth every morning. 90 tablet 1  ? DULoxetine (CYMBALTA) 60 MG capsule Take 1 capsule (60 mg total) by mouth daily. 90 capsule 3  ? traZODone (DESYREL) 50 MG tablet 1-2 tablets at night for sleep as needed 180 tablet 3  ? ?No current facility-administered medications for this visit.  ? ? ?Medication Side Effects: Sweating ? ?Allergies:  ?Allergies  ?Allergen Reactions  ? Hydrophilic Rash  ?   RASH ?Other reaction(s): RASH ?  ? Lorcaserin Other (See Comments)  ?  serotonin syndrome ?serotonin syndrome ?  ? Sulfamethoxazole   ?  REACTION: unspecified  ? ? ?Past Medical History:  ?Diagnosis Date  ? Depression   ? Fatty liver   ? Gallstone   ? GERD (gastroesophageal reflux disease)   ? Hyperglycemia   ? Hyperlipidemia   ? Dysmetabolic   ? Liver abscess   ? Entamoeba  histolytica dx'd HiPt 2011   ? Liver cyst   ? Malaria   ?  from [place]  ? Obstructive sleep apnea   ? mild NPSG- 07/30/09- AHI 4.8, RDI 15.4,  ? Osteoarthritis   ? Postmenopausal HRT (hormone replacement t

## 2022-07-05 ENCOUNTER — Other Ambulatory Visit: Payer: Self-pay | Admitting: Psychiatry

## 2022-07-05 DIAGNOSIS — G4733 Obstructive sleep apnea (adult) (pediatric): Secondary | ICD-10-CM

## 2022-09-12 ENCOUNTER — Other Ambulatory Visit: Payer: Self-pay | Admitting: Psychiatry

## 2022-09-12 DIAGNOSIS — G4733 Obstructive sleep apnea (adult) (pediatric): Secondary | ICD-10-CM

## 2022-10-16 ENCOUNTER — Other Ambulatory Visit: Payer: Self-pay | Admitting: Psychiatry

## 2022-10-16 DIAGNOSIS — G4733 Obstructive sleep apnea (adult) (pediatric): Secondary | ICD-10-CM

## 2022-10-28 NOTE — Telephone Encounter (Signed)
Pt called advising per pharmacy [facility] [address]. PA needed for Armodafinil 200 mg. Advise Pt status. [phone]

## 2022-10-30 ENCOUNTER — Other Ambulatory Visit: Payer: Self-pay | Admitting: Psychiatry

## 2022-10-30 DIAGNOSIS — F5105 Insomnia due to other mental disorder: Secondary | ICD-10-CM

## 2022-11-01 ENCOUNTER — Telehealth: Payer: Self-pay | Admitting: Psychiatry

## 2022-11-01 NOTE — Telephone Encounter (Signed)
[facility] approved Armodafinil/Nuvigil 200mg  tabls for 11/01/22-11/11/2023. [phone]

## 2022-12-22 ENCOUNTER — Encounter: Payer: Self-pay | Admitting: Psychiatry

## 2022-12-22 ENCOUNTER — Ambulatory Visit: Payer: BC Managed Care – PPO | Admitting: Psychiatry

## 2022-12-22 DIAGNOSIS — F331 Major depressive disorder, recurrent, moderate: Secondary | ICD-10-CM

## 2022-12-22 DIAGNOSIS — G4733 Obstructive sleep apnea (adult) (pediatric): Secondary | ICD-10-CM | POA: Diagnosis not present

## 2022-12-22 DIAGNOSIS — F5105 Insomnia due to other mental disorder: Secondary | ICD-10-CM | POA: Diagnosis not present

## 2022-12-22 MED ORDER — DULOXETINE HCL 60 MG PO CPEP: 60.0000 mg | ORAL_CAPSULE | Freq: Every day | ORAL | 3 refills | Status: DC

## 2022-12-22 MED ORDER — ARMODAFINIL 200 MG PO TABS: 200.0000 mg | ORAL_TABLET | Freq: Every morning | ORAL | 1 refills | Status: DC

## 2022-12-22 NOTE — Patient Instructions (Signed)
(  NAC) N-Acetylcysteine 2 of the  600 mg capsules daily to help with mild cognitive problems.  It can be combined with a B-complex vitamin as the B-12 and folate which can sometimes enhance the effect.  

## 2022-12-22 NOTE — Progress Notes (Signed)
[name] [id number] March 29, 1956 67 y.o.    Subjective:   Patient ID:  Gloria [name] is a 67 y.o. (DOB 04-Mar-1956) female.  Chief Complaint:  Chief Complaint  Patient presents with   Follow-up    Depression        Associated symptoms include no decreased concentration, no myalgias and no suicidal ideas.  Gloria [name] presents  today for follow-up of depression.  When seen February, 2020.  Her depression was worse.  We switched her from Viibryd to Cymbalta.  Trazodone was given for sleep.  seen November 08, 2018.  She gained benefit with the switch to duloxetine.  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  No med change. Continue duloxetine 90 mg daily. Benefit Nuvigil for alertness.  10/29/19 appt noted: Doing OK overall.  Sweats are terrible.  depression and anxiety under control.   FU oncologist and cancer is at bay at this point but not gone.  Glucose variable.  Still working.  Forgetful..  STM issues CPAP sleep study and got new machine.  Needs increase in pressure to 16. 7-8 hours sleep with the trazodone. Consistent with trazodone bc it helps.   Still working remotely. Enjoys her job.  Benefit with Cymbalta switch.  No More aches and pains except knee.  Much less pain.  Amazing.  SE sweating acceptable.  Took away the pain from the shots. Depression is fine and managed overall.  No crying anymore.  Anxiety is manageable. Plan: Reduce duloxetine to  60 mg daily.  DT sweats. Benefit Nuvigil for alertness.  07/08/2020 appointment with following noted: Covid free and working remote mostly. Generally not depressed.  Recognizes needs to get dressed and work remotely.  No problems with reduced dulxetine to 60 mg daily.  Sweats are a little better. Patient reports stable mood and denies depressed or irritable moods.  Not generally depressed usually.  Scripture helps.  Patient denies any recent difficulty with  anxiety.  Patient denies difficulty with sleep initiation or maintenance. Denies appetite disturbance.  Patient reports that energy and motivation have been good.  Patient denies any difficulty with concentration.  Patient denies any suicidal ideation. Going to [place] in Feb. Mood is a little unstable but not severely.  D Gloria [name] Still not drinking over 2 years. Convicted of DUI but limited driving privileges. Has to help Gloria [name].  Gloria [name] and Gloria [name] living with her temporarily still.  Gloria [name] 67 yo.   Not helping with Gloria Gloria [name] 67 yo with a lot of anger outbursts. But better on Ritalin 10 mg.  He's seeing a psychologist.   The school is helpful with counseling for him. Gloria [name] is very impulsive.  Will probably be held back.  Young for school KG.  Disc concerns about child psychologist. Plan: no med changes  12/17/20 appt with following noted: Went to [place] in Feb.  [place] time.   Gloria [name] and Gloria [name] still live with her DT $.  Gloria 67 yo on Ritalin.Gloria in counseling. Doing fine other wise.   Patient reports stable mood and denies depressed or irritable moods.  Patient denies any recent difficulty with anxiety.  Patient denies difficulty with sleep initiation or maintenance. Denies appetite disturbance.  Patient reports that energy and motivation have been good.  Patient denies any difficulty with concentration.  Patient denies any suicidal ideation.  12/23/21 appt noted: Getting monthly shot with cancer shot and doing well so far.   Holding  it at bay.  Has mets. Went to [place] on a cruise. Remains on duloxetine 60 and Nuvigil 200 mg daily.  Taking trazodone 100 mg HS for sleep needed. No SE problems now. No problems with depression.   TKR December 2022.  Doing really well.  Doing PT.  Next one in November. No problems with meds.   Alertness is good with Nuvigil.  Satisfied and worse off it. Using CPAP consistently.   Gloria lives with her and 67 yo and lives with them.  Seeing child psychiatrist, Dr.  Lupita [name].  12/22/22 appt  noted:  in office Remains on duloxetine 60 and Nuvigil 200 mg daily.  Taking trazodone 100 mg HS for sleep needed. Work from home 100%.  D also works from home. D sober.  Likes it fine.   Travel some. Went to [place].  Previously to [place], [place].  Going to [place] in fall.   2 knee surgeries helped. Patient reports stable mood and denies depressed or irritable moods.  Patient denies any recent difficulty with anxiety.  Patient denies difficulty with sleep initiation or maintenance with trazodone 100 HS. Denies appetite disturbance.  Patient reports that energy and motivation have been good.  Patient denies any difficulty with concentration.  Patient denies any suicidal ideation. Can't sleep without CPAP and trazodone.  Can't stay awake wihtout Nuvigil 200 mg AM. Gloria doing better.   Past psych med trials include venlafaxine, bupropion, Lexapro, citalopram, fluoxetine, duloxetine 90 sweats, Viibryd 60 ,  modafinil. Nuvigil 200,    Contrave, She has never been on Abilify.  Review of Systems:  Review of Systems  Constitutional:        Sweats  Cardiovascular:  Negative for palpitations.  Musculoskeletal:  Positive for arthralgias. Negative for myalgias.  Psychiatric/Behavioral:  Negative for agitation, behavioral problems, confusion, decreased concentration, dysphoric mood, hallucinations, self-injury, sleep disturbance and suicidal ideas. The patient is not nervous/anxious and is not hyperactive.     Medications: I have reviewed the patient's current medications.  Current Outpatient Medications  Medication Sig Dispense Refill   atorvastatin (LIPITOR) 10 MG tablet Take 10 mg by mouth daily.     Cholecalciferol 1.25 MG (50000 UT) TABS Take 5,000 Units by mouth.      Coenzyme Q10 100 MG capsule Take by mouth.     cycloSPORINE (RESTASIS) 0.05 % ophthalmic emulsion 1 drop 2 (two) times daily.     Dapagliflozin Propanediol (FARXIGA PO) Take 10 mg by mouth daily.      diclofenac sodium (VOLTAREN) 1 % GEL APP 2 GRAMS EXT AA QID     fenofibrate 160 MG tablet Take 160 mg by mouth daily.     iron polysaccharides (NIFEREX) 150 MG capsule Take by mouth.     lanreotide acetate (SOMATULINE DEPOT) 120 MG/0.5ML injection Inject into the skin.     levothyroxine (SYNTHROID, LEVOTHROID) 75 MCG tablet Take 100 mcg by mouth daily.     losartan (COZAAR) 25 MG tablet Take 25 mg by mouth daily.     metFORMIN (GLUCOPHAGE) 500 MG tablet Take 1,000 mg by mouth 2 (two) times daily with a meal.     MOUNJARO 7.5 MG/0.5ML Pen Inject 7.5 mg into the skin once a week.     Multiple Vitamin (MULTIVITAMIN) tablet Take 1 tablet by mouth daily.     RABEprazole (ACIPHEX) 20 MG tablet      traZODone (DESYREL) 50 MG tablet TAKE 1 TO 2 TABLETS NIGHTLYFOR SLEEP AS NEEDED 180 tablet 3   triamcinolone cream (KENALOG)  0.5 % Apply to rash BID.     ursodiol (ACTIGALL) 300 MG capsule TAKE 1 CAPSULE BY MOUTH TWICE DAILY     zinc gluconate 50 MG tablet Take 50 mg by mouth daily.     Armodafinil 200 MG TABS Take 1 tablet (200 mg total) by mouth every morning. 90 tablet 1   DULoxetine (CYMBALTA) 60 MG capsule Take 1 capsule (60 mg total) by mouth daily. 90 capsule 3   No current facility-administered medications for this visit.    Medication Side Effects: Sweating  Allergies:  Allergies  Allergen Reactions   Hydrophilic Rash     RASH Other reaction(s): RASH    Lorcaserin Other (See Comments)    serotonin syndrome serotonin syndrome    Sulfamethoxazole     REACTION: unspecified    Past Medical History:  Diagnosis Date   Depression    Fatty liver    Gallstone    GERD (gastroesophageal reflux disease)    Hyperglycemia    Hyperlipidemia    Dysmetabolic    Liver abscess    Entamoeba histolytica dx'd HiPt 2011    Liver cyst    Malaria     from [place]   Obstructive sleep apnea    mild NPSG- 07/30/09- AHI 4.8, RDI 15.4,   Osteoarthritis    Postmenopausal HRT (hormone replacement  therapy)    Urinary incontinence     Family History  Problem Relation Age of Onset   Diabetes Mother    Sleep apnea Mother    Heart failure Mother        deceased   Lung cancer Father        deceased   Hypertension Other        1st degree relative   Colon cancer Other    Arthritis Other    Coronary artery disease Other     Social History   Socioeconomic History   Marital status: Divorced    Spouse name: Not on file   Number of children: Not on file   Years of education: Not on file   Highest education level: Not on file  Occupational History   Occupation: Teacher, adult education: [employer]    Comment: [employer]  Tobacco Use   Smoking status: Never   Smokeless tobacco: Never  Substance and Sexual Activity   Alcohol use: No   Drug use: No   Sexual activity: Not on file  Other Topics Concern   Not on file  Social History Narrative   Not on file   Social Determinants of Health   Financial Resource Strain: Not on file  Food Insecurity: Not on file  Transportation Needs: Not on file  Physical Activity: Not on file  Stress: Not on file  Social Connections: Not on file  Intimate Partner Violence: Not on file    Past Medical History, Surgical history, Social history, and Family history were reviewed and updated as appropriate.   Please see review of systems for further details on the patient's review from today.   Objective:   Physical Exam:  There were no vitals taken for this visit.  Physical Exam Constitutional:      General: She is not in acute distress. Musculoskeletal:        General: No deformity.  Neurological:     Mental Status: She is alert and oriented to person, place, and time.     Cranial Nerves: No dysarthria.     Coordination: Coordination normal.  Psychiatric:        Attention and Perception: Attention and perception normal. She does not perceive auditory or visual hallucinations.        Mood and Affect: Mood normal. Mood  is not anxious or depressed. Affect is not labile, angry or inappropriate.        Speech: Speech normal.        Behavior: Behavior normal. Behavior is cooperative.        Thought Content: Thought content normal. Thought content is not paranoid or delusional. Thought content does not include homicidal or suicidal ideation. Thought content does not include suicidal plan.        Cognition and Memory: Cognition and memory normal.        Judgment: Judgment normal.     Comments: Insight good.     Lab Review:     Component Value Date/Time   NA 136 01/14/2010 1100   K 4.5 01/14/2010 1100   CL 101 01/14/2010 1100   CO2 26 01/14/2010 1100   GLUCOSE 88 01/14/2010 1100   BUN 11 01/14/2010 1100   CREATININE 0.85 01/14/2010 1100   CALCIUM 9.6 01/14/2010 1100   PROT 6.8 01/14/2010 1100   ALBUMIN 4.4 01/14/2010 1100   AST 30 01/14/2010 1100   ALT 25 01/14/2010 1100   ALKPHOS 48 01/14/2010 1100   BILITOT 0.4 01/14/2010 1100   GFRNONAA >60 01/14/2010 1100   GFRAA  01/14/2010 1100    >60        The eGFR has been calculated using the MDRD equation. This calculation has not been validated in all clinical situations. eGFR's persistently <60 mL/min signify possible Chronic Kidney Disease.       Component Value Date/Time   WBC 9.7 01/20/2010 0435   RBC 3.83 (L) 01/20/2010 0435   HGB 11.5 (L) 01/20/2010 0435   HCT 33.7 (L) 01/20/2010 0435   PLT 226 01/20/2010 0435   MCV 88.0 01/20/2010 0435   MCHC 34.0 01/20/2010 0435   RDW 14.0 01/20/2010 0435   LYMPHSABS 0.9 01/20/2010 0435   MONOABS 0.4 01/20/2010 0435   EOSABS 0.0 01/20/2010 0435   BASOSABS 0.0 01/20/2010 0435    No results found for: "POCLITH", "LITHIUM"   No results found for: "PHENYTOIN", "PHENOBARB", "VALPROATE", "CBMZ"   .res Assessment: Plan:    Major depressive disorder, recurrent episode, moderate (HCC) - Plan: DULoxetine (CYMBALTA) 60 MG capsule  Obstructive sleep apnea - Plan: Armodafinil 200 MG TABS  Insomnia  due to mental condition    30 min face to face time with patient was spent on counseling and coordination of care. We discussed Tyrone has a long history of depression which is often reactive.  She has been under my psychiatric care since 1998.   She is under more stress due to her grandson acting out and her daughter not helping.  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      Has done well with it as far as effectiveness because of the marked benefit in mood and reducing her pain level.   Disc SE in detail and SSRI withdrawal sx. discussed the risk of relapse and to call if she has any problems. Continue duloxetine to  60 mg daily  DT sweats which are some better and mood and anxiety are not worse. Benefit Nuvigil for alertness. 200 mg AM Trazodone 100 mg HS Very helpful.  No med changes  Trazodone for sleep 1 tablet working.  Discussed sleep apnea and  its treatment and her pressures in detail.  Disc chin straps to help with mouth awakening at night using nasal pillows at this time.  Brand new machine.  She is using CPAP regularly.  Less concerned about forgetfulness than in the past for unclear reasons.  Discussed side effects of each medication (NAC) N-Acetylcysteine 2 of the  600 mg capsules daily to help with mild cognitive problems.  It can be combined with a B-complex vitamin as the B-12 and folate which can sometimes enhance the effect.  Supportive therapy dealing with stress of Answered questions about stimulants for grandson.  Educated about dealing with this.  Follow-up 6 mos for 30  Meredith Staggers, MD, DFAPA Please see After Visit Summary for patient specific instructions.  No future appointments.  No orders of the defined types were placed in this encounter.     -------------------------------

## 2023-07-17 ENCOUNTER — Other Ambulatory Visit: Payer: Self-pay | Admitting: Psychiatry

## 2023-07-17 DIAGNOSIS — G4733 Obstructive sleep apnea (adult) (pediatric): Secondary | ICD-10-CM

## 2023-07-18 NOTE — Telephone Encounter (Signed)
Lf 08/18; lv 05/8 nv 05/14

## 2023-12-01 ENCOUNTER — Other Ambulatory Visit: Payer: Self-pay | Admitting: Psychiatry

## 2023-12-01 DIAGNOSIS — G4733 Obstructive sleep apnea (adult) (pediatric): Secondary | ICD-10-CM

## 2023-12-04 ENCOUNTER — Other Ambulatory Visit: Payer: Self-pay | Admitting: Psychiatry

## 2023-12-04 DIAGNOSIS — F5105 Insomnia due to other mental disorder: Secondary | ICD-10-CM

## 2023-12-04 DIAGNOSIS — F331 Major depressive disorder, recurrent, moderate: Secondary | ICD-10-CM

## 2023-12-28 ENCOUNTER — Ambulatory Visit: Payer: BC Managed Care – PPO | Admitting: Psychiatry

## 2023-12-28 ENCOUNTER — Encounter: Payer: Self-pay | Admitting: Psychiatry

## 2023-12-28 DIAGNOSIS — F5105 Insomnia due to other mental disorder: Secondary | ICD-10-CM

## 2023-12-28 DIAGNOSIS — G4733 Obstructive sleep apnea (adult) (pediatric): Secondary | ICD-10-CM | POA: Diagnosis not present

## 2023-12-28 DIAGNOSIS — F331 Major depressive disorder, recurrent, moderate: Secondary | ICD-10-CM

## 2023-12-28 MED ORDER — ARMODAFINIL 200 MG PO TABS: 200.0000 mg | ORAL_TABLET | Freq: Every morning | ORAL | 1 refills | Status: DC

## 2023-12-28 MED ORDER — TRAZODONE HCL 50 MG PO TABS: ORAL_TABLET | ORAL | 3 refills | Status: AC

## 2023-12-28 MED ORDER — DULOXETINE HCL 60 MG PO CPEP
60.0000 mg | ORAL_CAPSULE | Freq: Every day | ORAL | 3 refills | Status: AC
Start: 2023-12-28 — End: ?

## 2023-12-28 NOTE — Progress Notes (Signed)
 Gloria [name] [id number] 09/23/1955 67 y.o.    Subjective:   Patient ID:  Gloria [name] is a 68 y.o. (DOB August 03, 1956) female.  Chief Complaint:  Chief Complaint  Patient presents with   Follow-up    Gloria [name] presents  today for follow-up of depression.  When seen February, 2020.  Her depression was worse.  We switched her from Viibryd to Cymbalta .  Trazodone  was given for sleep.  seen November 08, 2018.  She gained benefit with the switch to duloxetine .  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  No med change. Continue duloxetine  90 mg daily. Benefit Nuvigil  for alertness.  10/29/19 appt noted: Doing OK overall.  Sweats are terrible.  depression and anxiety under control.   FU oncologist and cancer is at bay at this point but not gone.  Glucose variable.  Still working.  Forgetful..  STM issues CPAP sleep study and got new machine.  Needs increase in pressure to 16. 7-8 hours sleep with the trazodone . Consistent with trazodone  bc it helps.   Still working remotely. Enjoys her job.  Benefit with Cymbalta  switch.  No More aches and pains except knee.  Much less pain.  Amazing.  SE sweating acceptable.  Took away the pain from the shots. Depression is fine and managed overall.  No crying anymore.  Anxiety is manageable. Plan: Reduce duloxetine  to  60 mg daily.  DT sweats. Benefit Nuvigil  for alertness.  07/08/2020 appointment with following noted: Covid free and working remote mostly. Generally not depressed.  Recognizes needs to get dressed and work remotely.  No problems with reduced dulxetine to 60 mg daily.  Sweats are a little better. Patient reports stable mood and denies depressed or irritable moods.  Not generally depressed usually.  Scripture helps.  Patient denies any recent difficulty with anxiety.  Patient denies difficulty with sleep initiation or maintenance. Denies appetite disturbance.  Patient  reports that energy and motivation have been good.  Patient denies any difficulty with concentration.  Patient denies any suicidal ideation. Going to [place] in Feb. Mood is a little unstable but not severely.  D Gloria [name] Still not drinking over 2 years. Convicted of DUI but limited driving privileges. Has to help Gloria [name].  Gloria [name] and [name] living with her temporarily still.  Gloria [name].   Not helping with Gloria [name] Gloria [name] with a lot of anger outbursts. But better on Ritalin 10 mg.  He's seeing a psychologist.   The school is helpful with counseling for him. Gloria [name] is very impulsive.  Will probably be held back.  Young for school KG.  Disc concerns about child psychologist. Plan: no med changes  12/17/20 appt with following noted: Went to [place] in Feb.  [place] time.   Gloria [name] and Gloria [name] [name] still live with her DT $.  Gloria [name] 7 [name] on Ritalin.Gloria [name] in counseling. Doing fine other wise.   Patient reports stable mood and denies depressed or irritable moods.  Patient denies any recent difficulty with anxiety.  Patient denies difficulty with sleep initiation or maintenance. Denies appetite disturbance.  Patient reports that energy and motivation have been good.  Patient denies any difficulty with concentration.  Patient denies any suicidal ideation.  12/23/21 appt noted: Getting monthly shot with cancer shot and doing well so far.   Holding it at bay.  Has mets. Went to [place] on a cruise. Remains on duloxetine  60 and Nuvigil  200 mg daily.  Taking trazodone  100 mg HS for sleep needed. No SE problems now. No problems with depression.   TKR December 2022.  Doing really well.  Doing PT.  Next one in November. No problems with meds.   Alertness is good with Nuvigil .  Satisfied and worse off it. Using CPAP consistently.   Gloria [name] lives with her and 29 [name] and lives with them.  Seeing child psychiatrist, Dr. Remus Carmine.  12/22/22 appt  noted:  in office Remains on duloxetine  60 and Nuvigil  200 mg daily.  Taking trazodone  100 mg HS  for sleep needed. Work from home 100%.  D also works from home. D sober.  Likes it fine.   Travel some. Went to [place].  Previously to [place], [place].  Going to [place] in fall.   2 knee surgeries helped. Patient reports stable mood and denies depressed or irritable moods.  Patient denies any recent difficulty with anxiety.  Patient denies difficulty with sleep initiation or maintenance with trazodone  100 HS. Denies appetite disturbance.  Patient reports that energy and motivation have been good.  Patient denies any difficulty with concentration.  Patient denies any suicidal ideation. Can't sleep without CPAP and trazodone .  Can't stay awake wihtout Nuvigil  200 mg AM. Gloria [name] doing better.   12/28/23 appt noted: Med: duloxetine  60 and Nuvigil  200 mg daily.  Taking trazodone  100 mg HS for sleep needed.  NAC 1200 mg daily for memory. Changed insurance to [employer]. Can't function without Nuvigil  DT sleepiness. Gloria [name] and Gloria [name], Gloria [name],  still with her.  D is slob and this weighs her down. Mood ok other than situational stress with family mainly.   No SE.  Satisfied with meds.   Still on cancer meds.  Has cancer nurse than comes to visit.   Put her name on list at [facility] bc not sure Gloria [name] would take care of her.  D said in response she would take care of her. Pt still working to pay off debt.  Working on setting limits with D. Still involved in church.     Past psych med trials include  venlafaxine, bupropion, Lexapro, citalopram, fluoxetine, duloxetine  90 sweats, Viibryd 60 ,  modafinil. Nuvigil  200,    Contrave, She has never been on Abilify.  Review of Systems:  Review of Systems  Constitutional:        Sweats  Cardiovascular:  Negative for palpitations.  Musculoskeletal:  Positive for arthralgias. Negative for myalgias.  Psychiatric/Behavioral:  Negative for agitation, behavioral problems, confusion, decreased concentration, dysphoric mood, hallucinations, self-injury,  sleep disturbance and suicidal ideas. The patient is not nervous/anxious and is not hyperactive.     Medications: I have reviewed the patient's current medications.  Current Outpatient Medications  Medication Sig Dispense Refill   atorvastatin (LIPITOR) 10 MG tablet Take 10 mg by mouth daily.     Cholecalciferol 1.25 MG (50000 UT) TABS Take 5,000 Units by mouth.      Coenzyme Q10 100 MG capsule Take by mouth.     cycloSPORINE (RESTASIS) 0.05 % ophthalmic emulsion 1 drop 2 (two) times daily.     Dapagliflozin Propanediol (FARXIGA PO) Take 10 mg by mouth daily.     diclofenac sodium (VOLTAREN) 1 % GEL APP 2 GRAMS EXT AA QID     fenofibrate 160 MG tablet Take 160 mg by mouth daily.     iron polysaccharides (NIFEREX) 150 MG capsule Take by mouth.     lanreotide acetate (SOMATULINE DEPOT) 120 MG/0.5ML injection Inject into the skin.  levothyroxine (SYNTHROID, LEVOTHROID) 75 MCG tablet Take 100 mcg by mouth daily.     losartan (COZAAR) 25 MG tablet Take 25 mg by mouth daily.     metFORMIN (GLUCOPHAGE) 500 MG tablet Take 1,000 mg by mouth 2 (two) times daily with a meal.     MOUNJARO 7.5 MG/0.5ML Pen Inject 7.5 mg into the skin once a week.     Multiple Vitamin (MULTIVITAMIN) tablet Take 1 tablet by mouth daily.     RABEprazole (ACIPHEX) 20 MG tablet      triamcinolone cream (KENALOG) 0.5 % Apply to rash BID.     ursodiol (ACTIGALL) 300 MG capsule TAKE 1 CAPSULE BY MOUTH TWICE DAILY     zinc gluconate 50 MG tablet Take 50 mg by mouth daily.     Armodafinil  200 MG TABS Take 1 tablet (200 mg total) by mouth every morning. 90 tablet 1   DULoxetine  (CYMBALTA ) 60 MG capsule Take 1 capsule (60 mg total) by mouth daily. 90 capsule 3   traZODone  (DESYREL ) 50 MG tablet TAKE 1 TO 2 TABLETS NIGHTLYFOR SLEEP AS NEEDED 180 tablet 3   No current facility-administered medications for this visit.    Medication Side Effects: Sweating  Allergies:  Allergies  Allergen Reactions   Hydrophilic Rash      RASH Other reaction(s): RASH    Lorcaserin Other (See Comments)    serotonin syndrome serotonin syndrome    Sulfamethoxazole     REACTION: unspecified    Past Medical History:  Diagnosis Date   Depression    Fatty liver    Gallstone    GERD (gastroesophageal reflux disease)    Hyperglycemia    Hyperlipidemia    Dysmetabolic    Liver abscess    Entamoeba histolytica dx'd HiPt 2011    Liver cyst    Malaria     from [place]   Obstructive sleep apnea    mild NPSG- 07/30/09- AHI 4.8, RDI 15.4,   Osteoarthritis    Postmenopausal HRT (hormone replacement therapy)    Urinary incontinence     Family History  Problem Relation Age of Onset   Diabetes Mother    Sleep apnea Mother    Heart failure Mother        deceased   Lung cancer Father        deceased   Hypertension Other        1st degree relative   Colon cancer Other    Arthritis Other    Coronary artery disease Other     Social History   Socioeconomic History   Marital status: Divorced    Spouse name: Not on file   Number of children: Not on file   Years of education: Not on file   Highest education level: Not on file  Occupational History   Occupation: Teacher, adult education: [employer]    Comment: [employer]  Tobacco Use   Smoking status: Never   Smokeless tobacco: Never  Substance and Sexual Activity   Alcohol use: No   Drug use: No   Sexual activity: Not on file  Other Topics Concern   Not on file  Social History Narrative   Not on file   Social Drivers of Health   Financial Resource Strain: Low Risk  (06/22/2022)   Received from [facility], [facility]   Overall Financial Resource Strain (CARDIA)    Difficulty of Paying Living Expenses: Not hard at all  Food Insecurity:  No Food Insecurity (06/22/2022)   Received from [facility], [facility]   Hunger Vital Sign    Worried About Running Out of Food in the Last Year: Never true    Ran Out of Food in the Last Year:  Never true  Transportation Needs: No Transportation Needs (06/22/2022)   Received from [facility], [facility]   [facility] - Transportation    Lack of Transportation (Medical): No    Lack of Transportation (Non-Medical): No  Physical Activity: Insufficiently Active (06/22/2022)   Received from [facility], [facility]   Exercise Vital Sign    Days of Exercise per Week: 1 day    Minutes of Exercise per Session: 20 min  Stress: No Stress Concern Present (06/22/2022)   Received from [facility], [facility] - Occupational Stress Questionnaire    Feeling of Stress : Not at all  Social Connections: Moderately Integrated (06/22/2022)   Received from [facility], [facility]   Social Connection and Isolation Panel [NHANES]    Frequency of Communication with Friends and Family: More than three times a week    Frequency of Social Gatherings with Friends and Family: More than three times a week    Attends Religious Services: More than 4 times per year    Active Member of [employer] or Organizations: Yes    Attends Engineer, structural: More than 4 times per year    Marital Status: Divorced  Catering manager Violence: Not At Risk (06/22/2022)   Received from [facility], [facility]   Humiliation, Afraid, Rape, and Kick questionnaire    Fear of Current or Ex-Partner: No    Emotionally Abused: No    Physically Abused: No    Sexually Abused: No    Past Medical History, Surgical history, Social history, and Family history were reviewed and updated as appropriate.   Please see review of systems for further details on the patient's review from today.   Objective:   Physical Exam:  There were no vitals taken for this visit.  Physical Exam Constitutional:      General: She is not in acute distress. Musculoskeletal:        General: No deformity.  Neurological:     Mental Status: She is alert and oriented to  person, place, and time.     Cranial Nerves: No dysarthria.     Coordination: Coordination normal.  Psychiatric:        Attention and Perception: Attention and perception normal. She does not perceive auditory or visual hallucinations.        Mood and Affect: Mood normal. Mood is not anxious or depressed. Affect is not labile, angry or inappropriate.        Speech: Speech normal.        Behavior: Behavior normal. Behavior is cooperative.        Thought Content: Thought content normal. Thought content is not paranoid or delusional. Thought content does not include homicidal or suicidal ideation. Thought content does not include suicidal plan.        Cognition and Memory: Cognition and memory normal.        Judgment: Judgment normal.     Comments: Insight good. Chronic stress with D.       Lab Review:     Component Value Date/Time   NA 136 01/14/2010 1100   K 4.5 01/14/2010 1100  CL 101 01/14/2010 1100   CO2 26 01/14/2010 1100   GLUCOSE 88 01/14/2010 1100   BUN 11 01/14/2010 1100   CREATININE 0.85 01/14/2010 1100   CALCIUM 9.6 01/14/2010 1100   PROT 6.8 01/14/2010 1100   ALBUMIN 4.4 01/14/2010 1100   AST 30 01/14/2010 1100   ALT 25 01/14/2010 1100   ALKPHOS 48 01/14/2010 1100   BILITOT 0.4 01/14/2010 1100   GFRNONAA >60 01/14/2010 1100   GFRAA  01/14/2010 1100    >60        The eGFR has been calculated using the MDRD equation. This calculation has not been validated in all clinical situations. eGFR's persistently <60 mL/min signify possible Chronic Kidney Disease.       Component Value Date/Time   WBC 9.7 01/20/2010 0435   RBC 3.83 (L) 01/20/2010 0435   HGB 11.5 (L) 01/20/2010 0435   HCT 33.7 (L) 01/20/2010 0435   PLT 226 01/20/2010 0435   MCV 88.0 01/20/2010 0435   MCHC 34.0 01/20/2010 0435   RDW 14.0 01/20/2010 0435   LYMPHSABS 0.9 01/20/2010 0435   MONOABS 0.4 01/20/2010 0435   EOSABS 0.0 01/20/2010 0435   BASOSABS 0.0 01/20/2010 0435    No results  found for: "POCLITH", "LITHIUM"   No results found for: "PHENYTOIN", "PHENOBARB", "VALPROATE", "CBMZ"   .res Assessment: Plan:    Major depressive disorder, recurrent episode, moderate (HCC) - Plan: DULoxetine  (CYMBALTA ) 60 MG capsule  Obstructive sleep apnea - Plan: Armodafinil  200 MG TABS  Insomnia due to mental condition - Plan: traZODone  (DESYREL ) 50 MG tablet    30 min face to face time with patient was spent on counseling and coordination of care. We discussed Bekah has a long history of depression which is often reactive.  She has been under my psychiatric care since 1998.   She is under more stress due to her grandson acting out and her daughter not helping.  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      Has done well with it as far as effectiveness because of the marked benefit in mood and reducing her pain level.   Disc SE in detail and SSRI withdrawal sx. discussed the risk of relapse and to call if she has any problems. Continue duloxetine  to  60 mg daily  DT sweats which are some better and mood and anxiety are not worse. Benefit Nuvigil  for alertness. 200 mg AM Trazodone  100 mg HS Very helpful.  No med changes  Trazodone  for sleep 1 tablet working.  Discussed sleep apnea and its treatment and her pressures in detail.  Disc chin straps to help with mouth awakening at night using nasal pillows at this time.  Brand new machine.  She is using CPAP regularly.  Less concerned about forgetfulness than in the past for unclear reasons.  Discussed side effects of each medication (NAC) N-Acetylcysteine 2 of the  600 mg capsules daily to help with mild cognitive problems.  It can be combined with a B-complex vitamin as the B-12 and folate which can sometimes enhance the effect.  Counseling 20 min:  boundary setting with D and making plans for the future in vew of aging issues.  Supportive therapy dealing with stress of D who is somewhat  chronically  dependent on her.  Working on issues with D and trying to have more of a relationship with her.    Follow-up 12 mos  Nori Beat, MD, DFAPA Please see After Visit Summary  for patient specific instructions.  Future Appointments  Date Time Provider Department Center  12/31/2024 10:30 AM Cottle, Kennedy Peabody., MD [facility] None    No orders of the defined types were placed in this encounter.     -------------------------------

## 2023-12-29 ENCOUNTER — Telehealth: Payer: Self-pay

## 2023-12-29 NOTE — Telephone Encounter (Addendum)
 Prior Authorization Armodafinil  200 mg #30/30 [facility]  Approved Effective:  12/29/2023 - 12/28/2024

## 2024-01-12 ENCOUNTER — Other Ambulatory Visit: Payer: Self-pay | Admitting: Psychiatry

## 2024-01-12 DIAGNOSIS — G4733 Obstructive sleep apnea (adult) (pediatric): Secondary | ICD-10-CM

## 2024-05-18 ENCOUNTER — Other Ambulatory Visit: Payer: Self-pay | Admitting: Psychiatry

## 2024-05-18 DIAGNOSIS — G4733 Obstructive sleep apnea (adult) (pediatric): Secondary | ICD-10-CM

## 2024-07-17 ENCOUNTER — Other Ambulatory Visit: Payer: Self-pay | Admitting: Psychiatry

## 2024-07-17 ENCOUNTER — Telehealth: Payer: Self-pay | Admitting: Psychiatry

## 2024-07-17 DIAGNOSIS — G4733 Obstructive sleep apnea (adult) (pediatric): Secondary | ICD-10-CM

## 2024-07-17 NOTE — Telephone Encounter (Signed)
Dr. Jennelle Human sent in RF.

## 2024-07-17 NOTE — Telephone Encounter (Signed)
 Gloria [name] called and said that she needs a refill on her armodfinil 200 mg. The [facility] is [facility]

## 2024-12-31 ENCOUNTER — Ambulatory Visit: Admitting: Psychiatry
